# Patient Record
Sex: Female | Born: 1960 | Race: Black or African American | Hispanic: No | Marital: Single | Smoking: Never smoker
Health system: Southern US, Community
[De-identification: ages and names within clinical notes are randomized; demographics above are authoritative.]

## PROBLEM LIST (undated history)

## (undated) DIAGNOSIS — E854 Organ-limited amyloidosis: Secondary | ICD-10-CM

## (undated) DIAGNOSIS — Z79899 Other long term (current) drug therapy: Secondary | ICD-10-CM

## (undated) DIAGNOSIS — D638 Anemia in other chronic diseases classified elsewhere: Secondary | ICD-10-CM

## (undated) DIAGNOSIS — N183 Chronic kidney disease, stage 3 unspecified: Secondary | ICD-10-CM

## (undated) DIAGNOSIS — K819 Cholecystitis, unspecified: Secondary | ICD-10-CM

## (undated) DIAGNOSIS — G510 Bell's palsy: Secondary | ICD-10-CM

## (undated) DIAGNOSIS — Z79631 Long term (current) use of antimetabolite agent: Secondary | ICD-10-CM

## (undated) DIAGNOSIS — E859 Amyloidosis, unspecified: Secondary | ICD-10-CM

## (undated) DIAGNOSIS — R55 Syncope and collapse: Secondary | ICD-10-CM

## (undated) DIAGNOSIS — Z86718 Personal history of other venous thrombosis and embolism: Secondary | ICD-10-CM

## (undated) DIAGNOSIS — N049 Nephrotic syndrome with unspecified morphologic changes: Secondary | ICD-10-CM

## (undated) DIAGNOSIS — I5030 Unspecified diastolic (congestive) heart failure: Secondary | ICD-10-CM

## (undated) DIAGNOSIS — M069 Rheumatoid arthritis, unspecified: Secondary | ICD-10-CM

## (undated) DIAGNOSIS — I43 Cardiomyopathy in diseases classified elsewhere: Secondary | ICD-10-CM

## (undated) HISTORY — PX: COLONOSCOPY: SHX174

## (undated) HISTORY — PX: ABDOMINAL SURGERY: SHX537

## (undated) HISTORY — PX: TOTAL KNEE ARTHROPLASTY: SHX125

---

## 2010-07-13 DIAGNOSIS — M069 Rheumatoid arthritis, unspecified: Secondary | ICD-10-CM | POA: Diagnosis present

## 2021-01-28 DIAGNOSIS — R55 Syncope and collapse: Secondary | ICD-10-CM | POA: Insufficient documentation

## 2021-01-28 DIAGNOSIS — R778 Other specified abnormalities of plasma proteins: Secondary | ICD-10-CM | POA: Diagnosis present

## 2021-02-13 DIAGNOSIS — I10 Essential (primary) hypertension: Secondary | ICD-10-CM | POA: Diagnosis present

## 2021-02-24 DIAGNOSIS — D631 Anemia in chronic kidney disease: Secondary | ICD-10-CM | POA: Diagnosis present

## 2021-02-24 DIAGNOSIS — N189 Chronic kidney disease, unspecified: Secondary | ICD-10-CM | POA: Diagnosis present

## 2021-03-20 DIAGNOSIS — I825Y2 Chronic embolism and thrombosis of unspecified deep veins of left proximal lower extremity: Secondary | ICD-10-CM | POA: Diagnosis present

## 2021-03-20 DIAGNOSIS — Z7901 Long term (current) use of anticoagulants: Secondary | ICD-10-CM | POA: Insufficient documentation

## 2021-03-20 DIAGNOSIS — Z789 Other specified health status: Secondary | ICD-10-CM | POA: Diagnosis present

## 2021-03-20 DIAGNOSIS — IMO0001 Reserved for inherently not codable concepts without codable children: Secondary | ICD-10-CM | POA: Diagnosis present

## 2021-03-22 DIAGNOSIS — R42 Dizziness and giddiness: Secondary | ICD-10-CM | POA: Insufficient documentation

## 2021-03-22 DIAGNOSIS — R55 Syncope and collapse: Secondary | ICD-10-CM

## 2021-04-20 DIAGNOSIS — E8581 Light chain (AL) amyloidosis: Secondary | ICD-10-CM | POA: Diagnosis present

## 2021-05-06 DIAGNOSIS — I43 Cardiomyopathy in diseases classified elsewhere: Secondary | ICD-10-CM | POA: Diagnosis present

## 2021-05-06 DIAGNOSIS — E854 Organ-limited amyloidosis: Secondary | ICD-10-CM | POA: Diagnosis present

## 2021-08-26 ENCOUNTER — Other Ambulatory Visit: Payer: Self-pay

## 2021-08-26 ENCOUNTER — Emergency Department: Payer: Medicare (Managed Care)

## 2021-08-26 ENCOUNTER — Encounter: Payer: Self-pay | Admitting: Internal Medicine

## 2021-08-26 ENCOUNTER — Inpatient Hospital Stay
Admission: EM | Admit: 2021-08-26 | Discharge: 2021-08-31 | DRG: 312 | Disposition: A | Discharge status: Home Health | Payer: Medicare (Managed Care) | Attending: Internal Medicine | Admitting: Internal Medicine

## 2021-08-26 DIAGNOSIS — D61818 Other pancytopenia: Secondary | ICD-10-CM | POA: Diagnosis present

## 2021-08-26 DIAGNOSIS — D631 Anemia in chronic kidney disease: Secondary | ICD-10-CM | POA: Diagnosis present

## 2021-08-26 DIAGNOSIS — I214 Non-ST elevation (NSTEMI) myocardial infarction: Secondary | ICD-10-CM

## 2021-08-26 DIAGNOSIS — Z86711 Personal history of pulmonary embolism: Secondary | ICD-10-CM | POA: Diagnosis not present

## 2021-08-26 DIAGNOSIS — E876 Hypokalemia: Secondary | ICD-10-CM | POA: Diagnosis not present

## 2021-08-26 DIAGNOSIS — R55 Syncope and collapse: Secondary | ICD-10-CM | POA: Diagnosis not present

## 2021-08-26 DIAGNOSIS — E854 Organ-limited amyloidosis: Secondary | ICD-10-CM | POA: Diagnosis present

## 2021-08-26 DIAGNOSIS — Z20822 Contact with and (suspected) exposure to covid-19: Secondary | ICD-10-CM | POA: Diagnosis present

## 2021-08-26 DIAGNOSIS — Z86718 Personal history of other venous thrombosis and embolism: Secondary | ICD-10-CM | POA: Diagnosis not present

## 2021-08-26 DIAGNOSIS — IMO0001 Reserved for inherently not codable concepts without codable children: Secondary | ICD-10-CM | POA: Diagnosis present

## 2021-08-26 DIAGNOSIS — Z789 Other specified health status: Secondary | ICD-10-CM | POA: Diagnosis present

## 2021-08-26 DIAGNOSIS — E669 Obesity, unspecified: Secondary | ICD-10-CM | POA: Diagnosis present

## 2021-08-26 DIAGNOSIS — T451X5A Adverse effect of antineoplastic and immunosuppressive drugs, initial encounter: Secondary | ICD-10-CM | POA: Diagnosis present

## 2021-08-26 DIAGNOSIS — Z8261 Family history of arthritis: Secondary | ICD-10-CM

## 2021-08-26 DIAGNOSIS — N179 Acute kidney failure, unspecified: Secondary | ICD-10-CM | POA: Diagnosis not present

## 2021-08-26 DIAGNOSIS — E8581 Light chain (AL) amyloidosis: Secondary | ICD-10-CM | POA: Diagnosis present

## 2021-08-26 DIAGNOSIS — I825Y2 Chronic embolism and thrombosis of unspecified deep veins of left proximal lower extremity: Secondary | ICD-10-CM | POA: Diagnosis present

## 2021-08-26 DIAGNOSIS — Z7901 Long term (current) use of anticoagulants: Secondary | ICD-10-CM

## 2021-08-26 DIAGNOSIS — E440 Moderate protein-calorie malnutrition: Secondary | ICD-10-CM | POA: Diagnosis present

## 2021-08-26 DIAGNOSIS — G510 Bell's palsy: Secondary | ICD-10-CM | POA: Diagnosis present

## 2021-08-26 DIAGNOSIS — R079 Chest pain, unspecified: Secondary | ICD-10-CM | POA: Diagnosis not present

## 2021-08-26 DIAGNOSIS — E860 Dehydration: Secondary | ICD-10-CM | POA: Diagnosis present

## 2021-08-26 DIAGNOSIS — N1832 Chronic kidney disease, stage 3b: Secondary | ICD-10-CM | POA: Diagnosis present

## 2021-08-26 DIAGNOSIS — M069 Rheumatoid arthritis, unspecified: Secondary | ICD-10-CM | POA: Diagnosis present

## 2021-08-26 DIAGNOSIS — Z833 Family history of diabetes mellitus: Secondary | ICD-10-CM

## 2021-08-26 DIAGNOSIS — E538 Deficiency of other specified B group vitamins: Secondary | ICD-10-CM | POA: Diagnosis present

## 2021-08-26 DIAGNOSIS — Z79899 Other long term (current) drug therapy: Secondary | ICD-10-CM

## 2021-08-26 DIAGNOSIS — R778 Other specified abnormalities of plasma proteins: Secondary | ICD-10-CM | POA: Diagnosis not present

## 2021-08-26 DIAGNOSIS — Z6834 Body mass index (BMI) 34.0-34.9, adult: Secondary | ICD-10-CM | POA: Diagnosis not present

## 2021-08-26 DIAGNOSIS — N183 Chronic kidney disease, stage 3 unspecified: Secondary | ICD-10-CM | POA: Diagnosis not present

## 2021-08-26 DIAGNOSIS — I248 Other forms of acute ischemic heart disease: Secondary | ICD-10-CM | POA: Diagnosis present

## 2021-08-26 DIAGNOSIS — Z888 Allergy status to other drugs, medicaments and biological substances status: Secondary | ICD-10-CM

## 2021-08-26 DIAGNOSIS — Z91040 Latex allergy status: Secondary | ICD-10-CM

## 2021-08-26 DIAGNOSIS — D638 Anemia in other chronic diseases classified elsewhere: Secondary | ICD-10-CM | POA: Diagnosis not present

## 2021-08-26 DIAGNOSIS — N189 Chronic kidney disease, unspecified: Secondary | ICD-10-CM | POA: Diagnosis present

## 2021-08-26 DIAGNOSIS — E859 Amyloidosis, unspecified: Secondary | ICD-10-CM | POA: Diagnosis not present

## 2021-08-26 DIAGNOSIS — I43 Cardiomyopathy in diseases classified elsewhere: Secondary | ICD-10-CM | POA: Diagnosis present

## 2021-08-26 DIAGNOSIS — I5032 Chronic diastolic (congestive) heart failure: Secondary | ICD-10-CM | POA: Diagnosis present

## 2021-08-26 DIAGNOSIS — I130 Hypertensive heart and chronic kidney disease with heart failure and stage 1 through stage 4 chronic kidney disease, or unspecified chronic kidney disease: Secondary | ICD-10-CM | POA: Diagnosis present

## 2021-08-26 DIAGNOSIS — G909 Disorder of the autonomic nervous system, unspecified: Secondary | ICD-10-CM | POA: Diagnosis present

## 2021-08-26 DIAGNOSIS — I951 Orthostatic hypotension: Secondary | ICD-10-CM | POA: Diagnosis present

## 2021-08-26 DIAGNOSIS — Z91041 Radiographic dye allergy status: Secondary | ICD-10-CM

## 2021-08-26 DIAGNOSIS — Z8611 Personal history of tuberculosis: Secondary | ICD-10-CM | POA: Diagnosis not present

## 2021-08-26 DIAGNOSIS — I10 Essential (primary) hypertension: Secondary | ICD-10-CM | POA: Diagnosis present

## 2021-08-26 DIAGNOSIS — Z8249 Family history of ischemic heart disease and other diseases of the circulatory system: Secondary | ICD-10-CM

## 2021-08-26 DIAGNOSIS — R7989 Other specified abnormal findings of blood chemistry: Secondary | ICD-10-CM | POA: Diagnosis not present

## 2021-08-26 DIAGNOSIS — R42 Dizziness and giddiness: Secondary | ICD-10-CM | POA: Diagnosis not present

## 2021-08-26 HISTORY — DX: Long term (current) use of antimetabolite agent: Z79.631

## 2021-08-26 HISTORY — DX: Unspecified diastolic (congestive) heart failure: I50.30

## 2021-08-26 HISTORY — DX: Cardiomyopathy in diseases classified elsewhere: I43

## 2021-08-26 HISTORY — DX: Anemia in other chronic diseases classified elsewhere: D63.8

## 2021-08-26 HISTORY — DX: Rheumatoid arthritis, unspecified: M06.9

## 2021-08-26 HISTORY — DX: Organ-limited amyloidosis: E85.4

## 2021-08-26 HISTORY — DX: Syncope and collapse: R55

## 2021-08-26 HISTORY — DX: Nephrotic syndrome with unspecified morphologic changes: N04.9

## 2021-08-26 HISTORY — DX: Other long term (current) drug therapy: Z79.899

## 2021-08-26 HISTORY — DX: Cholecystitis, unspecified: K81.9

## 2021-08-26 HISTORY — DX: Amyloidosis, unspecified: E85.9

## 2021-08-26 HISTORY — DX: Chronic kidney disease, stage 3 unspecified: N18.30

## 2021-08-26 HISTORY — DX: Personal history of other venous thrombosis and embolism: Z86.718

## 2021-08-26 HISTORY — DX: Bell's palsy: G51.0

## 2021-08-26 LAB — COMPREHENSIVE METABOLIC PANEL
ALT: 10 U/L (ref 0–44)
AST: 15 U/L (ref 15–41)
Albumin: 2.5 g/dL — ABNORMAL LOW (ref 3.5–5.0)
Alkaline Phosphatase: 49 U/L (ref 38–126)
Anion gap: 12 (ref 5–15)
BUN: 22 mg/dL — ABNORMAL HIGH (ref 6–20)
CO2: 19 mmol/L — ABNORMAL LOW (ref 22–32)
Calcium: 8.1 mg/dL — ABNORMAL LOW (ref 8.9–10.3)
Chloride: 107 mmol/L (ref 98–111)
Creatinine, Ser: 1.87 mg/dL — ABNORMAL HIGH (ref 0.44–1.00)
GFR, Estimated: 31 mL/min — ABNORMAL LOW (ref >60)
Glucose, Bld: 100 mg/dL — ABNORMAL HIGH (ref 70–99)
Potassium: 3.3 mmol/L — ABNORMAL LOW (ref 3.5–5.1)
Sodium: 138 mmol/L (ref 135–145)
Total Bilirubin: 1.1 mg/dL (ref 0.3–1.2)
Total Protein: 4.3 g/dL — ABNORMAL LOW (ref 6.5–8.1)

## 2021-08-26 LAB — CBC
HCT: 27.9 % — ABNORMAL LOW (ref 36.0–46.0)
Hemoglobin: 9.2 g/dL — ABNORMAL LOW (ref 12.0–15.0)
MCH: 32.1 pg (ref 26.0–34.0)
MCHC: 33 g/dL (ref 30.0–36.0)
MCV: 97.2 fL (ref 80.0–100.0)
Platelets: 67 10*3/uL — ABNORMAL LOW (ref 150–400)
RBC: 2.87 MIL/uL — ABNORMAL LOW (ref 3.87–5.11)
RDW: 16.7 % — ABNORMAL HIGH (ref 11.5–15.5)
WBC: 3 10*3/uL — ABNORMAL LOW (ref 4.0–10.5)
nRBC: 0 % (ref 0.0–0.2)

## 2021-08-26 LAB — MAGNESIUM: Magnesium: 1.6 mg/dL — ABNORMAL LOW (ref 1.7–2.4)

## 2021-08-26 LAB — RESP PANEL BY RT-PCR (FLU A&B, COVID) ARPGX2
Influenza A by PCR: NEGATIVE
Influenza B by PCR: NEGATIVE
SARS Coronavirus 2 by RT PCR: NEGATIVE

## 2021-08-26 LAB — APTT: aPTT: 20 seconds — ABNORMAL LOW (ref 24–36)

## 2021-08-26 LAB — TROPONIN I (HIGH SENSITIVITY)
Troponin I (High Sensitivity): 170 ng/L (ref <18)
Troponin I (High Sensitivity): 189 ng/L (ref <18)

## 2021-08-26 LAB — PROTIME-INR
INR: 1 (ref 0.8–1.2)
Prothrombin Time: 13.3 seconds (ref 11.4–15.2)

## 2021-08-26 MED ORDER — ACETAMINOPHEN 325 MG PO TABS: 650.0000 mg | ORAL_TABLET | ORAL | Status: DC | PRN

## 2021-08-26 MED ORDER — ONDANSETRON HCL 4 MG/2ML IJ SOLN: 4.0000 mg | Freq: Four times a day (QID) | INTRAMUSCULAR | Status: DC | PRN

## 2021-08-26 MED ORDER — SODIUM CHLORIDE 0.45 % IV SOLN: INTRAVENOUS | Status: DC

## 2021-08-26 MED ORDER — LACTATED RINGERS IV BOLUS
1000.0000 mL | Freq: Once | INTRAVENOUS | Status: AC
  Administered 2021-08-26: 1000 mL via INTRAVENOUS

## 2021-08-26 MED ORDER — HEPARIN SODIUM (PORCINE) 5000 UNIT/ML IJ SOLN: 4000.0000 [IU] | Freq: Once | INTRAMUSCULAR | Status: DC

## 2021-08-26 MED ORDER — VALACYCLOVIR HCL 500 MG PO TABS
500.0000 mg | ORAL_TABLET | Freq: Every day | ORAL | Status: DC
  Administered 2021-08-27 – 2021-08-31 (×5): 500 mg via ORAL
  Filled 2021-08-26 (×5): qty 1

## 2021-08-26 MED ORDER — HEPARIN (PORCINE) 25000 UT/250ML-% IV SOLN
800.0000 [IU]/h | INTRAVENOUS | Status: DC
  Administered 2021-08-27: 800 [IU]/h via INTRAVENOUS
  Filled 2021-08-26: qty 250

## 2021-08-26 MED ORDER — VITAMIN B-6 50 MG PO TABS
25.0000 mg | ORAL_TABLET | Freq: Every day | ORAL | Status: DC
  Administered 2021-08-27 – 2021-08-31 (×5): 25 mg via ORAL
  Filled 2021-08-26 (×5): qty 0.5

## 2021-08-26 MED ORDER — PROCHLORPERAZINE MALEATE 10 MG PO TABS
10.0000 mg | ORAL_TABLET | Freq: Four times a day (QID) | ORAL | Status: DC | PRN
  Filled 2021-08-26: qty 1

## 2021-08-26 MED ORDER — VITAMIN D 25 MCG (1000 UNIT) PO TABS
5000.0000 [IU] | ORAL_TABLET | Freq: Every day | ORAL | Status: DC
  Administered 2021-08-27 – 2021-08-31 (×5): 5000 [IU] via ORAL
  Filled 2021-08-26 (×5): qty 5

## 2021-08-26 MED ORDER — LORATADINE 10 MG PO TABS
10.0000 mg | ORAL_TABLET | Freq: Every day | ORAL | Status: DC
  Administered 2021-08-27 – 2021-08-31 (×5): 10 mg via ORAL
  Filled 2021-08-26 (×5): qty 1

## 2021-08-26 MED ORDER — VITAMIN B-12 1000 MCG PO TABS
1000.0000 ug | ORAL_TABLET | Freq: Every day | ORAL | Status: DC
  Administered 2021-08-27 – 2021-08-31 (×5): 1000 ug via ORAL
  Filled 2021-08-26 (×5): qty 1

## 2021-08-26 MED ORDER — HEPARIN BOLUS VIA INFUSION
3800.0000 [IU] | Freq: Once | INTRAVENOUS | Status: AC
  Administered 2021-08-27: 3800 [IU] via INTRAVENOUS
  Filled 2021-08-26: qty 3800

## 2021-08-26 MED ORDER — MAGNESIUM SULFATE 2 GM/50ML IV SOLN
2.0000 g | Freq: Once | INTRAVENOUS | Status: AC
  Administered 2021-08-27: 2 g via INTRAVENOUS
  Filled 2021-08-26: qty 50

## 2021-08-26 NOTE — ED Notes (Signed)
Pt OTF with CT, aware of need for urine sample.

## 2021-08-26 NOTE — ED Provider Notes (Signed)
[facility] Emergency Department Provider Note ____________________________________________   Event Date/Time   First MD Initiated Contact with Patient 08/26/21 2046     (approximate)  I have reviewed the triage vital signs and the nursing notes.  HISTORY  Chief Complaint Loss of Consciousness   HPI Tammy Hardin is a 60 y.o. femalewho presents to the ED for evaluation of dizziness and feeling poorly.   Chart review indicates no history in our system. Patient self-reports a history of DVT on Eliquis, amyloidosis receiving chemotherapy infusions at [facility].  Bell's palsy on valacyclovir. She lives at home alone, but her son and daughter each see her once a week and spend 1 or 2 days with her, and she stays by herself for a few days per week. I review some MyChart results on patient's phone at the bedside, including a troponin of 150 from 4 weeks ago.  She presents today with her son, who picked her up today, for evaluation of a possible syncopal episode.  Patient reports that she often gets these episodes where she feels dizzy when she stands up and sometimes syncopizes.  She reports an episode today where she stood up from a seated position in a car, that was typical for these intermittent and chronic episodes.  Son reports witnessing this episode and her eyes glazed over and she stared off into space, he reports that her right arm and right leg were stiff.  This lasted about a minute before self resolving.  No rhythmic activity, tongue biting or incontinence.  Patient reports some episodes of epigastric/lower chest discomfort intermittently, as recently as 2 to 3 days ago.  Denies emesis, syncope, fever or shortness of breath.  No past medical history on file.  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 Authorizing Provider  Cholecalciferol 125 MCG (5000 UT) TABS Take 1 tablet by mouth daily. 03/20/21  Yes [provider]  Cyanocobalamin 1000 MCG SUBL Place 1 tablet under the tongue daily. 03/20/21 03/20/22 Yes [provider]  ELIQUIS 2.5 MG TABS tablet Take 2.5 mg by mouth 2 (two) times daily. 08/15/21  Yes [provider]  loratadine (CLARITIN) 10 MG tablet Take 10 mg by mouth daily.   Yes [provider]  prochlorperazine (COMPAZINE) 10 MG tablet Take 10 mg by mouth every 6 (six) hours as needed. 08/23/21  Yes [provider]  pyridOXINE (VITAMIN B-6) 25 MG tablet Take 25 mg by mouth daily.   Yes [provider]  valACYclovir (VALTREX) 500 MG tablet Take 500 mg by mouth daily. 08/16/21  Yes [provider]  clotrimazole (MYCELEX) 10 MG troche  07/30/21   [provider]    Allergies Humira [adalimumab], Iodine, and Latex  No family history on file.  Social History    Review of Systems  Constitutional: No fever/chills.  Positive for episodic weakness and possible syncope. Eyes: No visual changes. ENT: No sore throat. Cardiovascular: Positive for  chest pain. Respiratory: Denies shortness of breath. Gastrointestinal: No abdominal pain.  No nausea, no vomiting.  No diarrhea.  No constipation. Genitourinary: Negative for dysuria. Musculoskeletal: Negative for back pain. Skin: Negative for rash. Neurological: Negative for headaches, focal weakness or numbness. ____________________________________________   PHYSICAL  EXAM:  VITAL SIGNS: Vitals:   08/26/21 2200 08/26/21 2230  BP: 123/71 115/70  Pulse: 74 79  Resp: 12 14  Temp:    SpO2: 100% 100%     Constitutional: Alert and oriented. Well appearing and in no acute distress. Eyes: Conjunctivae are normal. PERRL. EOMI. Head: Atraumatic. Nose: No congestion/rhinnorhea. Mouth/Throat: Mucous membranes are moist.  Oropharynx non-erythematous. Neck: No stridor. No cervical spine tenderness to palpation. Cardiovascular: Normal rate, regular rhythm. Grossly normal heart sounds.  Good peripheral circulation. Respiratory: Normal respiratory effort.  No retractions. Lungs CTAB. Gastrointestinal: Soft , nondistended, nontender to palpation. No CVA tenderness. Musculoskeletal: No lower extremity tenderness nor edema.  No joint effusions. No signs of acute trauma. Neurologic:  Normal speech and language. No gross focal neurologic deficits are appreciated.  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 Skin:  Skin is warm, dry and intact. No rash noted. Psychiatric: Mood and affect are normal. Speech and behavior are normal.  ____________________________________________   LABS (all labs ordered are listed, but only abnormal results are displayed)  Labs Reviewed  CBC - Abnormal; Notable for the following components:      Result Value   WBC 3.0 (*)    RBC 2.87 (*)    Hemoglobin 9.2 (*)    HCT 27.9 (*)    RDW 16.7 (*)    Platelets 67 (*)    All other components within normal limits  COMPREHENSIVE METABOLIC PANEL - Abnormal; Notable for the following components:   Potassium 3.3 (*)    CO2 19 (*)    Glucose, Bld 100 (*)    BUN 22 (*)    Creatinine, Ser 1.87 (*)    Calcium 8.1 (*)    Total Protein 4.3 (*)    Albumin 2.5 (*)    GFR, Estimated 31 (*)    All other components within normal limits  MAGNESIUM - Abnormal; Notable for the following components:   Magnesium 1.6 (*)    All other components  within normal limits  APTT - Abnormal; Notable for the following components:   aPTT 20 (*)    All other components within normal limits  TROPONIN I (HIGH SENSITIVITY) - Abnormal; Notable for the following components:   Troponin I (High Sensitivity) 170 (*)    All other components within normal limits  TROPONIN I (HIGH SENSITIVITY) - Abnormal; Notable for the following components:   Troponin I (High Sensitivity) 189 (*)    All other components within normal limits  RESP PANEL BY RT-PCR (FLU A&B, COVID) [id number]  PROTIME-INR  URINALYSIS, COMPLETE (UACMP) WITH MICROSCOPIC   ____________________________________________  12 Lead EKG  Sinus rhythm with a rate of 78 bpm.  Normal axis and intervals.  No evidence of acute ischemia. ____________________________________________  RADIOLOGY  ED MD interpretation: CT head reviewed by me without evidence of acute intracranial pathology. 1 view CXR reviewed by me without evidence of acute cardiopulmonary pathology.  Official radiology report(s): CT HEAD WO CONTRAST ( )  Result Date: 08/26/2021 CLINICAL DATA:  Possible seizure and syncope. EXAM: CT HEAD WITHOUT CONTRAST TECHNIQUE: Contiguous axial images were obtained from the base of the skull through the vertex without intravenous contrast. COMPARISON:  None.  FINDINGS: Brain: No evidence of acute infarction, hemorrhage, hydrocephalus, extra-axial collection or mass lesion/mass effect. Vascular: No hyperdense vessel or unexpected calcification. Skull: Normal. Negative for fracture or focal lesion. Sinuses/Orbits: No acute finding. Other: None. IMPRESSION: No acute intracranial pathology. Electronically Signed   By: Aram Candela M.D.   On: 08/26/2021 21:59   DG Chest Portable 1 View  Result Date: 08/26/2021 CLINICAL DATA:  Possible MI. Syncope versus shaking. Evaluate for infiltrate. EXAM: PORTABLE CHEST 1 VIEW COMPARISON:  None. FINDINGS: No pneumothorax. The heart, hila, and mediastinum are  normal. No pulmonary nodules or masses. No focal infiltrates. IMPRESSION: No active disease. Electronically Signed   By: Gerome Sam III M.D.   On: 08/26/2021 21:37    ____________________________________________   PROCEDURES and INTERVENTIONS  Procedure(s) performed (including Critical Care):  .1-3 Lead EKG Interpretation  Date/Time: 08/26/2021 11:05 PM Performed by: Delton Prairie, MD Authorized by: Delton Prairie, MD     Interpretation: normal     ECG rate:  74   ECG rate assessment: normal     Rhythm: sinus rhythm     Ectopy: none     Conduction: normal   .Critical Care  Date/Time: 08/26/2021 11:05 PM Performed by: Delton Prairie, MD Authorized by: Delton Prairie, MD   Critical care provider statement:    Critical care time (minutes):  45   Critical care was necessary to treat or prevent imminent or life-threatening deterioration of the following conditions:  Cardiac failure   Critical care was time spent personally by me on the following activities:  Discussions with consultants, evaluation of patient's response to treatment, examination of patient, ordering and performing treatments and interventions, ordering and review of laboratory studies, ordering and review of radiographic studies, pulse oximetry, re-evaluation of patient's condition, obtaining history from patient or surrogate and review of old charts  Medications  heparin injection 4,000 Units (has no administration in time range)  lactated ringers bolus 1,000 mL (1,000 mLs Intravenous New Bag/Given 08/26/21 2141)    ____________________________________________   MDM / ED COURSE   60 year old woman with a history of DVT on Eliquis and amyloidosis, presents to the ED with worsening episodic dizziness and presyncope, sounds like vasovagal syncope, but with increasing troponins and some intermittent atypical chest pains over the past few weeks, requiring medical admission for cardiac evaluation for NSTEMI.  Blood work  shows rising troponins, but her EKG is reassuring without ischemic features.  Imaging is reassuring without evidence of ICH, CVA or cardiopulmonary infiltrates on CXR.  Blood work with CKD around baseline.  Due to her episodic dizziness and increasing troponins, concern for cardiac pathology, we will start her on heparin and discuss with medicine for admission.  Clinical Course as of 08/26/21 2305  Wynelle Link Aug 26, 2021  2124 I reivew MyChart from [place] on [employer]. Less concerned now.  [DS]  2245 I discussed with son and patient at the bedside.  She reports feeling fine is sitting up drinking some water, and watching television.  We talked about her increasing troponins and a concern for possible cardiac pathology.  We discussed observation admission and starting heparin until cardiac evaluation.  She is in agreement. [DS]    Clinical Course User Index [DS] Delton Prairie, MD    ____________________________________________   FINAL CLINICAL IMPRESSION(S) / ED DIAGNOSES  Final diagnoses:  NSTEMI (non-ST elevated myocardial infarction) (HCC)  Syncope, unspecified syncope type     ED Discharge Orders     None  Delton Prairie   Note:  This document was prepared using Conservation officer, historic buildings and may include unintentional dictation errors.    Delton Prairie, MD 08/26/21 [id number]

## 2021-08-26 NOTE — Progress Notes (Signed)
ANTICOAGULATION CONSULT NOTE   Pharmacy Consult for heparin infusion Indication: ACS/STEMI  Allergies  Allergen Reactions   Humira [Adalimumab]    Iodine    Latex     Patient Measurements: Height: 5' (152.4 cm) Weight: 80.7 kg (178 lb) IBW/kg (Calculated) : 45.5 Heparin Dosing Weight: 64 kg  Vital Signs: Temp: 98.5 F (36.9 C) (08/28 1929) Temp Source: Oral (08/28 1929) BP: 115/70 (08/28 2230) Pulse Rate: 79 (08/28 2230)  Labs: Recent Labs    08/26/21 1936 08/26/21 2130  HGB 9.2*  --   HCT 27.9*  --   PLT 67*  --   APTT  --  20*  LABPROT  --  13.3  INR  --  1.0  CREATININE 1.87*  --   TROPONINIHS 170* 189*    Estimated Creatinine Clearance: 30.5 mL/min (A) (by C-G formula based on SCr of 1.87 mg/dL (H)).   Medical History: History reviewed. No pertinent past medical history.  Medications:  PTA Med: Apixaban 2.5 mg BID  Assessment: Pt is 60 yo female w/ hx or DVT, presenting to ED due to dizziness and general malaise found with elevated Troponin I, trending up.  Goal of Therapy:  Heparin level 0.3-0.7 units/ml Monitor platelets by anticoagulation protocol: Yes Target aPTT 66-102   Plan:  Give bolus 3800 units x 1 Start heparin infusion at 800 units/hr Due to DOAC PTA, will follow aPTT until correlation with HL Check aPTT in 6 hrs after start of infusion Daily CBC while on heparin.  Otelia Sergeant, PharmD, [facility] 08/26/2021 11:25 PM

## 2021-08-26 NOTE — ED Notes (Signed)
Awaiting heparin gtt orders to administer bolus. Per MD, gtt orders to come.

## 2021-08-26 NOTE — ED Triage Notes (Signed)
Per ems pt with possible syncope vs. "Shaking". Pt states she stood up and felt like she passed out. Pt states has not felt well this week and has not really consumed any po. Pt states has had diarrhea and some shob.

## 2021-08-26 NOTE — H&P (Signed)
History and Physical   Mora Pedraza FIE:[id number] DOB: [birth year] DOA: 08/26/2021  Referring MD/NP/PA: Dr. Delton Prairie  PCP: Reather Converse, MD   Outpatient Specialists: At [facility]  Patient coming from: Home  Chief Complaint: Chest pain and passing out  HPI: Tammy Hardin is a 60 y.o. female with medical history significant of disseminated amyloidosis including cardiac amyloidosis, history of DVT on Eliquis, rheumatoid arthritis, chronically elevated troponins, anemia of chronic disease, chronic kidney disease stage II who was brought in secondary to syncopal episode and chest pain.  Patient has been getting chemotherapy apparently at [facility] for amyloidosis.  She has had on and off dizzy spells with occasional chest discomfort.  She was recently seen at [facility] apparently and had some mildly elevated troponins but no admission or work-up was performed.  Probably presumed to be due to cardiac amyloid.  She presented today after a weakness syncopal episode by her son.  Patient also had some chest discomfort.  This lasted a few minutes.  No fever or chills no nausea vomiting but had some diaphoresis.  Patient was seen in the ER and evaluated.  EKG appears to be stable but troponin seems to be rising.  She is therefore presumed to have had low to intermediate risk non-ST elevation MI and is being admitted for further evaluation and treatment.  ED Course: Temperature 98.5 blood pressure 130/79, pulse 83 respiratory of 19 oxygen sat 100% on room air.  White count 3.1 hemoglobin 9.1 platelets 67.  Sodium is 138 potassium 3.3 chloride 107 CO2 of 19 BUN 22 creatinine 1.87 and calcium 8.1. Magnesium 1.6.  Initial troponin 177 troponin 189.  INR 1.0.  Respiratory panel negative.  Head CT without contrast showed no acute findings.  Chest x-ray shows no acute disease.  Patient being admitted for treatment and management of non-ST elevation MI.  Review of Systems: As per HPI otherwise 10  point review of systems negative.    History reviewed. No pertinent past medical history.  History reviewed. No pertinent surgical history.   reports that she has never smoked. She has never used smokeless tobacco. No history on file for alcohol use and drug use.  Allergies  Allergen Reactions   Humira [Adalimumab]    Iodine    Latex     History reviewed. No pertinent family history.   Prior to Admission medications   Medication Sig Start Date End Date Taking? Authorizing Provider  Cholecalciferol 125 MCG (5000 UT) TABS Take 1 tablet by mouth daily. 03/20/21  Yes [provider]  Cyanocobalamin 1000 MCG SUBL Place 1 tablet under the tongue daily. 03/20/21 03/20/22 Yes [provider]  ELIQUIS 2.5 MG TABS tablet Take 2.5 mg by mouth 2 (two) times daily. 08/15/21  Yes [provider]  loratadine (CLARITIN) 10 MG tablet Take 10 mg by mouth daily.   Yes [provider]  prochlorperazine (COMPAZINE) 10 MG tablet Take 10 mg by mouth every 6 (six) hours as needed. 08/23/21  Yes [provider]  pyridOXINE (VITAMIN B-6) 25 MG tablet Take 25 mg by mouth daily.   Yes [provider]  valACYclovir (VALTREX) 500 MG tablet Take 500 mg by mouth daily. 08/16/21  Yes [provider]  clotrimazole (MYCELEX) 10 MG troche  07/30/21   [provider]    Physical Exam: Vitals:   08/26/21 2100 08/26/21 2130 08/26/21 2200 08/26/21 2230  BP: 119/77 131/79 123/71 115/70  Pulse: 75 83 74 79  Resp: 19 16 12  14  Temp:      TempSrc:      SpO2: 100% 100% 100% 100%  Weight:      Height:          Constitutional: Acutely ill looking, anxious Vitals:   08/26/21 2100 08/26/21 2130 08/26/21 2200 08/26/21 2230  BP: 119/77 131/79 123/71 115/70  Pulse: 75 83 74 79  Resp: Temp:      TempSrc:      SpO2: 100% 100% 100% 100%  Weight:      Height:       Eyes: PERRL, lids and conjunctivae normal ENMT: Mucous membranes are dry.  Posterior pharynx clear of any exudate or lesions.Normal dentition.  Neck: normal, supple, no masses, no thyromegaly Respiratory: clear to auscultation bilaterally, no wheezing, no crackles. Normal respiratory effort. No accessory muscle use.  Cardiovascular: Regular rate and rhythm, grade 2 diastolic murmur/ rubs / gallops. No extremity edema. 2+ pedal pulses. No carotid bruits.  Abdomen: no tenderness, no masses palpated. No hepatosplenomegaly. Bowel sounds positive.  Musculoskeletal: no clubbing / cyanosis. No joint deformity upper and lower extremities. Good ROM, no contractures. Normal muscle tone.  Skin: no rashes, lesions, ulcers. No induration Neurologic: CN 2-12 grossly intact. Sensation intact, DTR normal. Strength 5/5 in all 4.  Psychiatric: Normal judgment and insight. Alert and oriented x 3.  Anxious mood.     Labs on Admission: I have personally reviewed following labs and imaging studies  CBC: Recent Labs  Lab 08/26/21 1936  WBC 3.0*  HGB 9.2*  HCT 27.9*  MCV 97.2  PLT 67*   Basic Metabolic Panel: Recent Labs  Lab 08/26/21 1936 08/26/21 2130  NA 138  --   K 3.3*  --   CL 107  --   CO2 19*  --   GLUCOSE 100*  --   BUN 22*  --   CREATININE 1.87*  --   CALCIUM 8.1*  --   MG  --  1.6*   GFR: Estimated Creatinine Clearance: 30.5 mL/min (A) (by C-G formula based on SCr of 1.87 mg/dL (H)). Liver Function Tests: Recent Labs  Lab 08/26/21 1936  AST 15  ALT 10  ALKPHOS 49  BILITOT 1.1  PROT 4.3*  ALBUMIN 2.5*   No results for input(s): LIPASE, AMYLASE in the last 168 hours. No results for input(s): AMMONIA in the last 168 hours. Coagulation Profile: Recent Labs  Lab 08/26/21 2130  INR 1.0   Cardiac Enzymes: No results for input(s): CKTOTAL, CKMB, CKMBINDEX, TROPONINI in the last 168 hours. BNP (last 3 results) No results for input(s): PROBNP in the last 8760 hours. HbA1C: No results for input(s): HGBA1C in the last 72 hours. CBG: No results for  input(s): GLUCAP in the last 168 hours. Lipid Profile: No results for input(s): CHOL, HDL, LDLCALC, TRIG, CHOLHDL, LDLDIRECT in the last 72 hours. Thyroid Function Tests: No results for input(s): TSH, T4TOTAL, FREET4, T3FREE, THYROIDAB in the last 72 hours. Anemia Panel: No results for input(s): VITAMINB12, FOLATE, FERRITIN, TIBC, IRON, RETICCTPCT in the last 72 hours. Urine analysis: No results found for: COLORURINE, APPEARANCEUR, LABSPEC, PHURINE, GLUCOSEU, HGBUR, BILIRUBINUR, KETONESUR, PROTEINUR, UROBILINOGEN, NITRITE, LEUKOCYTESUR Sepsis Labs: (procalcitonin:4,lacticidven:4) ) Recent Results (from the past 240 hour(s))  Resp Panel by RT-PCR (Flu A&B, Covid) Nasopharyngeal Swab     Status: None   Collection Time: 08/26/21  9:30 PM   Specimen: Nasopharyngeal Swab; Nasopharyngeal(NP) swabs in vial transport medium  Result Value Ref Range Status  SARS Coronavirus 2 by RT PCR NEGATIVE NEGATIVE Final    Comment: (NOTE) SARS-CoV-2 target nucleic acids are NOT DETECTED.  The SARS-CoV-2 RNA is generally detectable in upper respiratory specimens during the acute phase of infection. The lowest concentration of SARS-CoV-2 viral copies this assay can detect is 138 copies/mL. A negative result does not preclude SARS-Cov-2 infection and should not be used as the sole basis for treatment or other patient management decisions. A negative result may occur with  improper specimen collection/handling, submission of specimen other than nasopharyngeal swab, presence of viral mutation(s) within the areas targeted by this assay, and inadequate number of viral copies(<138 copies/mL). A negative result must be combined with clinical observations, patient history, and epidemiological information. The expected result is Negative.  Fact Sheet for Patients:  BloggerCourse.com  Fact Sheet for Healthcare Providers:  SeriousBroker.it  This test  is no t yet approved or cleared by the [place] FDA and  has been authorized for detection and/or diagnosis of SARS-CoV-2 by FDA under an Emergency Use Authorization (EUA). This EUA will remain  in effect (meaning this test can be used) for the duration of the COVID-19 declaration under Section 564(b)(1) of the Act, 21 U.S.C.section 360bbb-3(b)(1), unless the authorization is terminated  or revoked sooner.       Influenza A by PCR NEGATIVE NEGATIVE Final   Influenza B by PCR NEGATIVE NEGATIVE Final    Comment: (NOTE) The Xpert Xpress SARS-CoV-2/FLU/RSV plus assay is intended as an aid in the diagnosis of influenza from Nasopharyngeal swab specimens and should not be used as a sole basis for treatment. Nasal washings and aspirates are unacceptable for Xpert Xpress SARS-CoV-2/FLU/RSV testing.  Fact Sheet for Patients: BloggerCourse.com  Fact Sheet for Healthcare Providers: SeriousBroker.it  This test is not yet approved or cleared by the [place] FDA and has been authorized for detection and/or diagnosis of SARS-CoV-2 by FDA under an Emergency Use Authorization (EUA). This EUA will remain in effect (meaning this test can be used) for the duration of the COVID-19 declaration under Section 564(b)(1) of the Act, 21 U.S.C. section 360bbb-3(b)(1), unless the authorization is terminated or revoked.  Performed at [facility], [address], [place], [place] [address]      Radiological Exams on Admission: CT HEAD WO CONTRAST ( )  Result Date: 08/26/2021 CLINICAL DATA:  Possible seizure and syncope. EXAM: CT HEAD WITHOUT CONTRAST TECHNIQUE: Contiguous axial images were obtained from the base of the skull through the vertex without intravenous contrast. COMPARISON:  None. FINDINGS: Brain: No evidence of acute infarction, hemorrhage, hydrocephalus, extra-axial collection or mass lesion/mass effect. Vascular: No  hyperdense vessel or unexpected calcification. Skull: Normal. Negative for fracture or focal lesion. Sinuses/Orbits: No acute finding. Other: None. IMPRESSION: No acute intracranial pathology. Electronically Signed   By: Aram Candela M.D.   On: 08/26/2021 21:59   DG Chest Portable 1 View  Result Date: 08/26/2021 CLINICAL DATA:  Possible MI. Syncope versus shaking. Evaluate for infiltrate. EXAM: PORTABLE CHEST 1 VIEW COMPARISON:  None. FINDINGS: No pneumothorax. The heart, hila, and mediastinum are normal. No pulmonary nodules or masses. No focal infiltrates. IMPRESSION: No active disease. Electronically Signed   By: Gerome Sam III M.D.   On: 08/26/2021 21:37    EKG: Independently reviewed.  Shows normal sinus rhythm with a rate of 78, low voltage EKG, no significant ST changes.  Assessment/Plan Principal Problem:   NSTEMI (non-ST elevated myocardial infarction) (HCC) Active Problems:   AL amyloidosis (HCC)   Anemia  in chronic kidney disease   Cardiac amyloidosis (HCC)   Chronic deep vein thrombosis (DVT) of proximal vein of left lower extremity (HCC)   Hypertension   Patient is Jehovah's Witness   Rheumatoid arthritis (HCC)   Syncope and collapse   Troponin level elevated   Pancytopenia (HCC)   Hypomagnesemia   AKI (acute kidney injury) (HCC)     #1 non-ST elevation MI: Patient qualifies for low to medium risk non-ST elevation MI.  Admitted and started on heparin GTT.  Holding her Eliquis momentarily.  Cycle enzymes and get echocardiogram.  Would likely benefit from cardiac consultation in the morning for further evaluation.  In the meantime aspirin and statin will be beneficial most likely.  #2 syncope: Probably as a result of #1 above.  Could also be related to cardiac amyloidosis leading to some arrhythmias.  Will monitor on telemetry and hydrate.  Follow echo results.  #3 essential hypertension: Does not appear to be on any home regimen.  We will monitor if needed and  treat.  #4 disseminated amyloidosis: Including cardiac amyloidosis.  Following with outpatient treatment.  Continue  #5 history of DVT: On Eliquis.  Holding Eliquis for now.  On heparin drip.  #6 history of rheumatoid arthritis: Does not appear to be on active treatment.  Continue to monitor  #7 hypomagnesemia: Replete magnesium  #8 AKI on CKD 3: Hydrate and monitor renal function  #9 pancytopenia: Most likely secondary to recent chemo for amyloidosis.  Continue to monitor.   DVT prophylaxis: Heparin drip Code Status: Full code Family Communication: Son Disposition Plan: Home Consults called: None but cardiology consult in a.m. Admission status: Inpatient  Severity of Illness: The appropriate patient status for this patient is INPATIENT. Inpatient status is judged to be reasonable and necessary in order to provide the required intensity of service to ensure the patient's safety. The patient's presenting symptoms, physical exam findings, and initial radiographic and laboratory data in the context of their chronic comorbidities is felt to place them at high risk for further clinical deterioration. Furthermore, it is not anticipated that the patient will be medically stable for discharge from the hospital within 2 midnights of admission. The following factors support the patient status of inpatient.   " The patient's presenting symptoms include chest pain and syncope. " The worrisome physical exam findings include dehydration. " The initial radiographic and laboratory data are worrisome because of elevated troponins. " The chronic co-morbidities include disseminated amyloidosis.   * I certify that at the point of admission it is my clinical judgment that the patient will require inpatient hospital care spanning beyond 2 midnights from the point of admission due to high intensity of service, high risk for further deterioration and high frequency of surveillance required.Lonia Blood  MD [facility] Pager [phone]  If 7PM-7AM, please contact night-coverage [email or web address] Password [facility]  08/26/2021, 11:19 PM

## 2021-08-27 ENCOUNTER — Encounter: Payer: Self-pay | Admitting: Internal Medicine

## 2021-08-27 ENCOUNTER — Inpatient Hospital Stay (HOSPITAL_COMMUNITY)
Admit: 2021-08-27 | Discharge: 2021-08-27 | Disposition: A | Discharge status: Home / Self Care | Payer: Medicare (Managed Care) | Attending: Internal Medicine | Admitting: Internal Medicine

## 2021-08-27 ENCOUNTER — Inpatient Hospital Stay: Payer: Medicare (Managed Care)

## 2021-08-27 DIAGNOSIS — R7989 Other specified abnormal findings of blood chemistry: Secondary | ICD-10-CM

## 2021-08-27 DIAGNOSIS — E854 Organ-limited amyloidosis: Secondary | ICD-10-CM | POA: Diagnosis not present

## 2021-08-27 DIAGNOSIS — R55 Syncope and collapse: Secondary | ICD-10-CM | POA: Diagnosis not present

## 2021-08-27 DIAGNOSIS — Z8611 Personal history of tuberculosis: Secondary | ICD-10-CM

## 2021-08-27 DIAGNOSIS — N179 Acute kidney failure, unspecified: Secondary | ICD-10-CM

## 2021-08-27 DIAGNOSIS — R42 Dizziness and giddiness: Secondary | ICD-10-CM | POA: Diagnosis not present

## 2021-08-27 DIAGNOSIS — R079 Chest pain, unspecified: Secondary | ICD-10-CM | POA: Diagnosis not present

## 2021-08-27 DIAGNOSIS — R778 Other specified abnormalities of plasma proteins: Secondary | ICD-10-CM

## 2021-08-27 DIAGNOSIS — N183 Chronic kidney disease, stage 3 unspecified: Secondary | ICD-10-CM

## 2021-08-27 DIAGNOSIS — I248 Other forms of acute ischemic heart disease: Secondary | ICD-10-CM | POA: Diagnosis present

## 2021-08-27 DIAGNOSIS — E859 Amyloidosis, unspecified: Secondary | ICD-10-CM

## 2021-08-27 DIAGNOSIS — I43 Cardiomyopathy in diseases classified elsewhere: Secondary | ICD-10-CM

## 2021-08-27 DIAGNOSIS — I951 Orthostatic hypotension: Secondary | ICD-10-CM | POA: Diagnosis not present

## 2021-08-27 DIAGNOSIS — E8581 Light chain (AL) amyloidosis: Secondary | ICD-10-CM

## 2021-08-27 DIAGNOSIS — Z86711 Personal history of pulmonary embolism: Secondary | ICD-10-CM

## 2021-08-27 DIAGNOSIS — D638 Anemia in other chronic diseases classified elsewhere: Secondary | ICD-10-CM

## 2021-08-27 LAB — APTT
aPTT: >160 seconds (ref 24–36)
aPTT: 152 seconds — ABNORMAL HIGH (ref 24–36)

## 2021-08-27 LAB — URINALYSIS, COMPLETE (UACMP) WITH MICROSCOPIC
Bilirubin Urine: NEGATIVE
Glucose, UA: NEGATIVE mg/dL
Ketones, ur: 5 mg/dL — AB
Nitrite: NEGATIVE
Protein, ur: 100 mg/dL — AB
Specific Gravity, Urine: 1.012 (ref 1.005–1.030)
pH: 5 (ref 5.0–8.0)

## 2021-08-27 LAB — ECHOCARDIOGRAM COMPLETE
AR max vel: 2.86 cm2
AV Area VTI: 2.84 cm2
AV Area mean vel: 2.86 cm2
AV Mean grad: 3 mmHg
AV Peak grad: 5.9 mmHg
Ao pk vel: 1.21 m/s
Area-P 1/2: 5.97 cm2
Height: 60 in
MV VTI: 3.27 cm2
S' Lateral: 2.3 cm
Weight: 2848 oz

## 2021-08-27 LAB — LIPID PANEL
Cholesterol: 195 mg/dL (ref 0–200)
HDL: 26 mg/dL — ABNORMAL LOW (ref >40)
LDL Cholesterol: 147 mg/dL — ABNORMAL HIGH (ref 0–99)
Total CHOL/HDL Ratio: 7.5 RATIO
Triglycerides: 108 mg/dL (ref <150)
VLDL: 22 mg/dL (ref 0–40)

## 2021-08-27 LAB — MAGNESIUM: Magnesium: 1.3 mg/dL — ABNORMAL LOW (ref 1.7–2.4)

## 2021-08-27 LAB — CBC
HCT: 21.8 % — ABNORMAL LOW (ref 36.0–46.0)
Hemoglobin: 7.4 g/dL — ABNORMAL LOW (ref 12.0–15.0)
MCH: 33.3 pg (ref 26.0–34.0)
MCHC: 33.9 g/dL (ref 30.0–36.0)
MCV: 98.2 fL (ref 80.0–100.0)
Platelets: 55 10*3/uL — ABNORMAL LOW (ref 150–400)
RBC: 2.22 MIL/uL — ABNORMAL LOW (ref 3.87–5.11)
RDW: 16.9 % — ABNORMAL HIGH (ref 11.5–15.5)
WBC: 2.3 10*3/uL — ABNORMAL LOW (ref 4.0–10.5)
nRBC: 0 % (ref 0.0–0.2)

## 2021-08-27 LAB — TROPONIN I (HIGH SENSITIVITY)
Troponin I (High Sensitivity): 203 ng/L (ref <18)
Troponin I (High Sensitivity): 143 ng/L (ref <18)

## 2021-08-27 LAB — CREATININE, SERUM
Creatinine, Ser: 1.35 mg/dL — ABNORMAL HIGH (ref 0.44–1.00)
GFR, Estimated: 45 mL/min — ABNORMAL LOW (ref >60)

## 2021-08-27 LAB — HEPARIN LEVEL (UNFRACTIONATED): Heparin Unfractionated: 1.01 IU/mL — ABNORMAL HIGH (ref 0.30–0.70)

## 2021-08-27 LAB — HIV ANTIBODY (ROUTINE TESTING W REFLEX): HIV Screen 4th Generation wRfx: NONREACTIVE

## 2021-08-27 MED ORDER — APIXABAN 2.5 MG PO TABS
2.5000 mg | ORAL_TABLET | Freq: Two times a day (BID) | ORAL | Status: DC
  Administered 2021-08-27 – 2021-08-31 (×9): 2.5 mg via ORAL
  Filled 2021-08-27 (×10): qty 1

## 2021-08-27 MED ORDER — GADOBUTROL 1 MMOL/ML IV SOLN
7.0000 mL | Freq: Once | INTRAVENOUS | Status: AC | PRN
  Administered 2021-08-27: 7 mL via INTRAVENOUS

## 2021-08-27 MED ORDER — MAGNESIUM SULFATE 2 GM/50ML IV SOLN
2.0000 g | Freq: Once | INTRAVENOUS | Status: AC
  Administered 2021-08-27: 2 g via INTRAVENOUS
  Filled 2021-08-27: qty 50

## 2021-08-27 MED ORDER — HEPARIN (PORCINE) 25000 UT/250ML-% IV SOLN
650.0000 [IU]/h | INTRAVENOUS | Status: DC
  Administered 2021-08-27: 650 [IU]/h via INTRAVENOUS

## 2021-08-27 MED ORDER — PERFLUTREN LIPID MICROSPHERE
1.0000 mL | INTRAVENOUS | Status: AC | PRN
  Administered 2021-08-27: 2 mL via INTRAVENOUS
  Filled 2021-08-27: qty 10

## 2021-08-27 MED ORDER — HEPARIN SODIUM (PORCINE) 5000 UNIT/ML IJ SOLN
5000.0000 [IU] | Freq: Three times a day (TID) | INTRAMUSCULAR | Status: DC
  Filled 2021-08-27: qty 1

## 2021-08-27 NOTE — Consult Note (Signed)
Cardiology Consultation:   Patient ID: Tammy Hardin; [id number]; Jun 09, 1961   Admit date: 08/26/2021 Date of Consult: 08/27/2021  Primary Care Provider: Mariane Duval, MD Primary Cardiologist: [facility] Primary Electrophysiologist:  None   Patient Profile:   Tammy Hardin is a 60 y.o. female with a hx of possible cardiac amyloidosis followed by [facility], HFpEF, CKD stage III, orthostasis with recurrent presyncope/syncope, anemia of chronic disease, history of DVT on Eliquis, nephrotic syndrome, pancytopenia, RA diagnosed in 2004, and Bell's palsy who is being seen today for the evaluation of elevated troponin at the request of Dr. Manuella Ghazi.  History of Present Illness:   Ms. Dinges this followed by the multidisciplinary team at [facility] for amyloidosis with possible cardiac amyloid.    She started developing symptoms of nausea, vomiting, abdominal pain, early satiety, and decreased appetite in 10/2020, and was evaluated by GI. She underwent EGD and colonoscopy. Biopsies were reviewed by [facility] pathology and demonstrated amyloid deposition. During this time, she had been referred to nephrology due to increase in creatinine and she was found to have 3 g per 24-hour of proteinuria and a kappa light chain of 371. 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 PET/CT did not reveal any lesions. She was referred to [facility] amyloid clinic.  She was admitted to [facility] cardiology service in 12/2020 in the setting of an episode of syncope when she was found to have an elevated troponin. She was noted to have positive orthostatic vital signs during that admission and her syncope was attributed to hypovolemia in the setting of poor appetite and decreased p.o. intake.  Outpatient diuretic has had to be tapered due to poor oral intake with associated dizziness.  On 8/28, patient had gotten out of the car and was sitting in a wheelchair.  While sitting, she  developed sudden onset of right sided rigidity.  She did not suffer LOC and was able to converse, albeit faint, with her son who was present.  Symptoms lasted approximately 1 minute followed by resolution without intervention.  No frank angina or dyspnea.  She does report palpitations during these episodes.  Due to symptoms she presented to [facility] where she was found to have stable vitals.  CT head was nonacute.  EKG nonacute.  High-sensitivity troponin 170 with a delta of 189, currently trended to 203.  Serum creatinine 1.87 which appears to be her approximate baseline.  Potassium 3.3.  Albumin 2.5.  Hgb 9.2.  WBC 3.0.  PLT 67.  Initial high-sensitivity troponin 170 with a delta of 189, currently trended 203.  She was started on a heparin drip.  Never with chest pain or dyspnea.  Currently asymptomatic.   Past Medical History:  Diagnosis Date   (HFpEF) heart failure with preserved ejection fraction (HCC)    Amyloid disease (HCC)    Anemia of chronic disease    Bell's palsy    Cardiac amyloidosis (HCC)    Cholecystitis    Chronic kidney disease (CKD), stage III (moderate) (HCC)    History of DVT (deep vein thrombosis)    Methotrexate, long term, current use    Nephrotic syndrome    RA (rheumatoid arthritis) ([place])    Syncope     Past Surgical History:  Procedure Laterality Date   ABDOMINAL SURGERY     COLONOSCOPY     TOTAL KNEE ARTHROPLASTY       Home Meds: Prior to Admission medications   Medication Sig Start  Date End Date Taking? Authorizing Provider  Cholecalciferol 125 MCG (5000 UT) TABS Take 1 tablet by mouth daily. 03/20/21  Yes [provider]  Cyanocobalamin 1000 MCG SUBL Place 1 tablet under the tongue daily. 03/20/21 03/20/22 Yes [provider]  ELIQUIS 2.5 MG TABS tablet Take 2.5 mg by mouth 2 (two) times daily. 08/15/21  Yes [provider]  loratadine (CLARITIN) 10 MG tablet Take 10 mg by mouth daily.   Yes [provider]  prochlorperazine  (COMPAZINE) 10 MG tablet Take 10 mg by mouth every 6 (six) hours as needed. 08/23/21  Yes [provider]  pyridOXINE (VITAMIN B-6) 25 MG tablet Take 25 mg by mouth daily.   Yes [provider]  valACYclovir (VALTREX) 500 MG tablet Take 500 mg by mouth daily. 08/16/21  Yes [provider]  clotrimazole (MYCELEX) 10 MG troche  07/30/21   [provider]    Inpatient Medications: Scheduled Meds:  cholecalciferol  5,000 Units Oral Daily   loratadine  10 mg Oral Daily   pyridOXINE  25 mg Oral Daily   valACYclovir  500 mg Oral Daily   vitamin B-12  1,000 mcg Oral Daily   Continuous Infusions:  sodium chloride 75 mL/hr at 08/27/21 0242   heparin 650 Units/hr (08/27/21 0930)   PRN Meds: acetaminophen, ondansetron (ZOFRAN) IV, prochlorperazine  Allergies:   Allergies  Allergen Reactions   Humira [Adalimumab]    Iodine    Latex     Social History:   Social History   Socioeconomic History   Marital status: Single    Spouse name: Not on file   Number of children: Not on file   Years of education: Not on file   Highest education level: Not on file  Occupational History   Not on file  Tobacco Use   Smoking status: Never   Smokeless tobacco: Never  Substance and Sexual Activity   Alcohol use: Not on file   Drug use: Not on file   Sexual activity: Not on file  Other Topics Concern   Not on file  Social History Narrative   Not on file   Social Determinants of Health   Financial Resource Strain: Not on file  Food Insecurity: Not on file  Transportation Needs: Not on file  Physical Activity: Not on file  Stress: Not on file  Social Connections: Not on file  Intimate Partner Violence: Not on file     Family History:   Family History  Problem Relation Age of Onset   Hypertension Mother    Arthritis Mother    Diabetes Mother    Heart disease Mother    Arthritis Father    Hypertension Father    Diabetes Father    Heart disease Father      ROS:  Review of Systems  Constitutional:  Positive for malaise/fatigue. Negative for chills, diaphoresis, fever and weight loss.  HENT:  Negative for congestion.   Eyes:  Negative for discharge and redness.  Respiratory:  Negative for cough, sputum production, shortness of breath and wheezing.   Cardiovascular:  Negative for chest pain, palpitations, orthopnea, claudication, leg swelling and PND.  Gastrointestinal:  Negative for abdominal pain, heartburn, nausea and vomiting.  Musculoskeletal:  Negative for falls and myalgias.  Skin:  Negative for rash.  Neurological:  Positive for dizziness and weakness. Negative for tingling, tremors, sensory change, speech change, focal weakness and loss of consciousness.       Right-sided rigidity   Endo/Heme/Allergies:  Does not bruise/bleed easily.  Psychiatric/Behavioral:  Negative for substance abuse. The patient is not nervous/anxious.   All other systems reviewed and are negative.    Physical Exam/Data:   Vitals:   08/27/21 0400 08/27/21 0500 08/27/21 0600 08/27/21 0700  BP: 109/79 116/68 113/76 118/75  Pulse: 71 70 68 67  Resp: (!) $RemoveB'29 15 13 14  'jEJQPggB$ Temp:      TempSrc:      SpO2: 100% 97% 100% 100%  Weight:      Height:        Intake/Output Summary (Last 24 hours) at 08/27/2021 0956 Last data filed at 08/27/2021 0835 Gross per 24 hour  Intake 1424.18 ml  Output --  Net 1424.18 ml   Filed Weights   08/26/21 1930  Weight: 80.7 kg   Body mass index is 34.76 kg/m.   Physical Exam: General: Well developed, well nourished, in no acute distress. Head: Normocephalic, atraumatic, sclera non-icteric, no xanthomas, nares without discharge.  Neck: Negative for carotid bruits. JVD not elevated. Lungs: Clear bilaterally to auscultation without wheezes, rales, or rhonchi. Breathing is unlabored. Heart: RRR with S1 S2. No murmurs, rubs, or gallops appreciated. Abdomen: Soft, non-tender, non-distended with normoactive bowel sounds. No  hepatomegaly. No rebound/guarding. No obvious abdominal masses. Msk:  Strength and tone appear normal for age. Extremities: No clubbing or cyanosis. No edema. Distal pedal pulses are 2+ and equal bilaterally. Neuro: Alert and oriented X 3. Left-sided facial asymmetry at baseline with Bell's palsy. No focal deficit. Moves all extremities spontaneously. Psych:  Responds to questions appropriately with a normal affect.   EKG:  The EKG was personally reviewed and demonstrates: NSR, 78 bpm, low voltage, poor R wave progression, nonspecific st/t changes Telemetry:  Telemetry was personally reviewed and demonstrates: SR  Weights: [employer]   08/26/21 1930  Weight: 80.7 kg    Relevant CV Studies:  2D echo 01/28/2021 [facility]): Summary    1. The left ventricle is normal in size with mildly to moderately increased  wall thickness.    2. The left ventricular systolic function is normal, LVEF is visually  estimated at 60-65%.    3. There is grade I diastolic dysfunction (impaired relaxation).    4. The right ventricle is normal in size, with normal systolic function. __________  Laboratory Data:  Chemistry Recent Labs  Lab 08/26/21 1936  NA 138  K 3.3*  CL 107  CO2 19*  GLUCOSE 100*  BUN 22*  CREATININE 1.87*  CALCIUM 8.1*  GFRNONAA 31*  ANIONGAP 12    Recent Labs  Lab 08/26/21 1936  PROT 4.3*  ALBUMIN 2.5*  AST 15  ALT 10  ALKPHOS 49  BILITOT 1.1   Hematology Recent Labs  Lab 08/26/21 1936  WBC 3.0*  RBC 2.87*  HGB 9.2*  HCT 27.9*  MCV 97.2  MCH 32.1  MCHC 33.0  RDW 16.7*  PLT 67*   Cardiac EnzymesNo results for input(s): TROPONINI in the last 168 hours. No results for input(s): TROPIPOC in the last 168 hours.  BNPNo results for input(s): BNP, PROBNP in the last 168 hours.  DDimer No results for input(s): DDIMER in the last 168 hours.  Radiology/Studies:  CT HEAD WO CONTRAST (5MM)  Result Date: 08/26/2021 IMPRESSION: No acute intracranial pathology.  Electronically Signed   By: Virgina Norfolk M.D.   On: 08/26/2021 21:59   DG Chest Portable 1 View  Result Date: 08/26/2021 IMPRESSION: No active disease. Electronically Signed   By: Dorise Bullion III  M.D.   On: 08/26/2021 21:37    Assessment and Plan:   1. Right-sided rigidity/presyncope with orthostatic hypotension/presumed autonomic dysfunction/malnutrition: -Never with frank syncope -Likely multifactorial including of underlying cardiac amyloidosis, malnutrition with inability to eat for the past week, emesis, deconditioning with bed-bound state this past week -Consider checking orthostatics, though has received IV fluids, readings would not be indicative of out of hospital event -Echo pending -Monitor on telemetry  -Does not appear to be a primary cardiac event -May benefit from outpatient cardiac monitoring  -Would benefit from dietary consult patient indicates she has not been able to keep any foods down this past week -Consider MRI brain  2. Elevated high sensitivity troponin: -Never with angina or dyspnea -Mildly elevated and flat trending, not consistent with ACS, and consistent with prior readings at [facility] earlier this year -Likely in the context of supply demand ischemia secondary to cardiac amyloidosis, CKD stage III, anemia of chronic disease, and possible transient hypotension with associated presyncope -Continue to cycle troponin to peak -Echo pending -Discontinue heparin drip given troponin elevation is not consistent with ACS, lack of anginal symptoms, and in the context of underlying anemia/thrombocytopenia  3. Cardiac amyloidosis: -Echo as above  -Followed by [facility] with evidence of increased wall thickness on echo with relatively low voltage on EKG and appearance of lateral ischemia in addition to chronically elevated cardiac biomarkers  4. CKD stage III: -Felt to be related to amyloid  -Stable -Avoid nephrotoxic agents  4. Anemia of chronic  disease: -Stable  5. History of DVT: -Occurred in the setting of knee surgery, though has been maintained on Eliquis    For questions or updates, please contact [place] Please consult [email or web address] for contact info under Cardiology/STEMI.   Signed, Christell Faith, PA-C [place] Pager: [phone] 08/27/2021, 9:56 AM

## 2021-08-27 NOTE — ED Notes (Signed)
ECHO at bedside.

## 2021-08-27 NOTE — Progress Notes (Signed)
ANTICOAGULATION CONSULT NOTE   Pharmacy Consult for heparin infusion Indication: ACS/STEMI  Allergies  Allergen Reactions   Humira [Adalimumab]    Iodine    Latex     Patient Measurements: Height: 5' (152.4 cm) Weight: 80.7 kg (178 lb) IBW/kg (Calculated) : 45.5 Heparin Dosing Weight: 64 kg  Vital Signs: BP: 118/75 (08/29 0700) Pulse Rate: 67 (08/29 0700)  Labs: Recent Labs    08/26/21 1936 08/26/21 2130 08/27/21 0337 08/27/21 0633  HGB 9.2*  --   --   --   HCT 27.9*  --   --   --   PLT 67*  --   --   --   APTT  --  20* >160* 152*  LABPROT  --  13.3  --   --   INR  --  1.0  --   --   HEPARINUNFRC  --   --  1.01*  --   CREATININE 1.87*  --   --   --   TROPONINIHS 170* 189* 203*  --      Estimated Creatinine Clearance: 30.5 mL/min (A) (by C-G formula based on SCr of 1.87 mg/dL (H)).   Medical History: History reviewed. No pertinent past medical history.  Medications:  PTA Med: Apixaban 2.5 mg BID  Assessment: Pt is 60 yo female w/ hx or DVT, presenting to ED due to dizziness and general malaise found with elevated Troponin I, trending up.  Date Time aPTT Rate/comment 8/29 0633 152 800 mL/hr, supratherapeutic    Goal of Therapy:  Heparin level 0.3-0.7 units/ml Monitor platelets by anticoagulation protocol: Yes Target aPTT 66-102   Plan:  aPTT supratherapeutic Hold heparin infusion for 1 hour then resume at reduced rate of 650 units/hr Due to DOAC PTA, will follow aPTT until correlation with HL Check aPTT in 6 hrs after rate change Daily CBC while on heparin.  Murphy Bundick L Ariella Voit,PharmD 08/27/2021 8:14 AM

## 2021-08-27 NOTE — ED Notes (Signed)
Patient transported to MRI 

## 2021-08-27 NOTE — ED Notes (Signed)
Dr. Sherryll Burger and Family at bedside

## 2021-08-27 NOTE — Plan of Care (Signed)
  Problem: Education: Goal: Knowledge of General Education information will improve Description: Including pain rating scale, medication(s)/side effects and non-pharmacologic comfort measures 08/27/2021 1742 by Ansel Bong, RN Outcome: Progressing 08/27/2021 1741 by Ansel Bong, RN Outcome: Progressing   Problem: Health Behavior/Discharge Planning: Goal: Ability to manage health-related needs will improve 08/27/2021 1742 by Ansel Bong, RN Outcome: Progressing 08/27/2021 1741 by Ansel Bong, RN Outcome: Progressing   Problem: Clinical Measurements: Goal: Ability to maintain clinical measurements within normal limits will improve 08/27/2021 1742 by Ansel Bong, RN Outcome: Progressing 08/27/2021 1741 by Ansel Bong, RN Outcome: Progressing Goal: Will remain free from infection 08/27/2021 1742 by Ansel Bong, RN Outcome: Progressing 08/27/2021 1741 by Ansel Bong, RN Outcome: Progressing Goal: Diagnostic test results will improve 08/27/2021 1742 by Ansel Bong, RN Outcome: Progressing 08/27/2021 1741 by Ansel Bong, RN Outcome: Progressing Goal: Respiratory complications will improve 08/27/2021 1742 by Ansel Bong, RN Outcome: Progressing 08/27/2021 1741 by Ansel Bong, RN Outcome: Progressing Goal: Cardiovascular complication will be avoided 08/27/2021 1742 by Ansel Bong, RN Outcome: Progressing 08/27/2021 1741 by Ansel Bong, RN Outcome: Progressing   Problem: Activity: Goal: Risk for activity intolerance will decrease 08/27/2021 1742 by Ansel Bong, RN Outcome: Progressing 08/27/2021 1741 by Ansel Bong, RN Outcome: Progressing   Problem: Nutrition: Goal: Adequate nutrition will be maintained 08/27/2021 1742 by Ansel Bong, RN Outcome: Progressing 08/27/2021 1741 by Ansel Bong, RN Outcome: Progressing   Problem: Coping: Goal: Level of anxiety will decrease 08/27/2021 1742 by Ansel Bong, RN Outcome:  Progressing 08/27/2021 1741 by Ansel Bong, RN Outcome: Progressing   Problem: Elimination: Goal: Will not experience complications related to bowel motility 08/27/2021 1742 by Ansel Bong, RN Outcome: Progressing 08/27/2021 1741 by Ansel Bong, RN Outcome: Progressing Goal: Will not experience complications related to urinary retention 08/27/2021 1742 by Ansel Bong, RN Outcome: Progressing 08/27/2021 1741 by Ansel Bong, RN Outcome: Progressing   Problem: Pain Managment: Goal: General experience of comfort will improve 08/27/2021 1742 by Ansel Bong, RN Outcome: Progressing 08/27/2021 1741 by Ansel Bong, RN Outcome: Progressing   Problem: Safety: Goal: Ability to remain free from injury will improve 08/27/2021 1742 by Ansel Bong, RN Outcome: Progressing 08/27/2021 1741 by Ansel Bong, RN Outcome: Progressing   Problem: Skin Integrity: Goal: Risk for impaired skin integrity will decrease 08/27/2021 1742 by Ansel Bong, RN Outcome: Progressing 08/27/2021 1741 by Ansel Bong, RN Outcome: Progressing

## 2021-08-27 NOTE — ED Notes (Signed)
Per pharmacy patient heparin gtt to hold for 1 hour and restart at 650unit/hr

## 2021-08-27 NOTE — ED Notes (Signed)
Patient assisted to bedside commode, minimal assist, UA collected. Patient provided with hygiene kit.  

## 2021-08-27 NOTE — Consult Note (Signed)
Neurology Consultation Reason for Consult: Possible seizure Referring Physician: Karlene Lineman  CC: Possible seizure  History is obtained from: Patient, son  HPI: Tammy Hardin is a 60 y.o. female with a history of amyloid heart disease, frequent presyncope and syncope due to her heart disease as well as rheumatoid arthritis who presents with transient episode of right arm stiffening.  She was sitting at the time when her son states that her right arm and leg both stiffened.  Her left side was not affected.  He asked her question and she mumbled something incoherent, but was not speaking clearly.  Her arm remained stiff for approximately 1 minute, following which she stated that she needed to vomit and then did so.  She is amnestic to this event.  She denies any previous episode of motor activity such as this, but she does have a history of syncope.  She describes this as preceded by lightheaded and ringing in her ears, following which she has lost consciousness.  These have not been witnessed, so unclear if there was any focal motor activity with them.  She was diagnosed with Bell's palsy in the past, and has had a "lazy eye" since then.   ROS: A 14 point ROS was performed and is negative except as noted in the HPI.   Past Medical History:  Diagnosis Date   (HFpEF) heart failure with preserved ejection fraction (HCC)    Amyloid disease (HCC)    Anemia of chronic disease    Bell's palsy    Cardiac amyloidosis (HCC)    Cholecystitis    Chronic kidney disease (CKD), stage III (moderate) (HCC)    History of DVT (deep vein thrombosis)    Methotrexate, long term, current use    Nephrotic syndrome    RA (rheumatoid arthritis) (HCC)    Syncope      Family History  Problem Relation Age of Onset   Hypertension Mother    Arthritis Mother    Diabetes Mother    Heart disease Mother    Arthritis Father    Hypertension Father    Diabetes Father    Heart disease Father      Social History:   reports that she has never smoked. She has never used smokeless tobacco. No history on file for alcohol use and drug use.   Exam: Current vital signs: BP 134/82 (BP Location: Right Arm)   Pulse 79   Temp 97.6 F (36.4 C)   Resp 17   Ht 5' (1.524 m)   Wt 80.7 kg   SpO2 100%   BMI 34.76 kg/m  Vital signs in last 24 hours: Temp:  [97.6 F (36.4 C)-98.5 F (36.9 C)] 97.6 F (36.4 C) (08/29 1720) Pulse Rate:  [67-87] 79 (08/29 1720) Resp:  [12-29] 17 (08/29 1720) BP: (90-138)/(61-82) 134/82 (08/29 1720) SpO2:  [97 %-100 %] 100 % (08/29 1720) Weight:  [80.7 kg] 80.7 kg (08/28 1930)   Physical Exam  Constitutional: Appears well-developed and well-nourished.  Psych: Affect appropriate to situation Eyes: No scleral injection HENT: No OP obstruction MSK: no joint deformities.  Cardiovascular: Normal rate and regular rhythm.  Respiratory: Effort normal, non-labored breathing GI: Soft.  No distension. There is no tenderness.  Skin: WDI  Neuro: Mental Status: Patient is awake, alert, oriented to person, place, month, year, and situation. Patient is able to give a clear and coherent history. No signs of aphasia or neglect Cranial Nerves: II: Visual Fields are full. Pupils are equal, round, and reactive  to light.   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 XII: tongue is midline without atrophy or fasciculations.  Motor: Tone is normal. Bulk is normal. 5/5 strength was present in all four extremities.  Sensory: Sensation is symmetric to light touch and temperature in the arms and legs. Cerebellar: No clear  ataxia     I have reviewed labs in epic and the results pertinent to this consultation are: GFR 31 Sodium 138 Mild leukopenia at 3.0  I have reviewed the images obtained: MRI brain-no clear acute findings  Impression: Tammy Hardin is a very complicated, very nice 60 year old female with a history of AL amyloidosis on chemotherapy with daratumumab, cyclophosphamide, bortezomib, dexamethasone.  Cranial nerve involvement with amyloidosis is fairly rare, but has been reported.  I am not sure even if we were to prove cranial nerve involvement in this disease if we would modify our therapy given that she is already on what appears to be a fairly aggressive chemotherapeutic regimen.  Rheumatoid arthritis can cause an aseptic meningitis with meningeal thickening which does not necessarily need to be associated with headache, but can cause focal seizures as well as cranial neuropathies.  Further imaging with contrasted MRI could be exceedingly helpful.  Microvascular 3rd nerve palsy as well as idiopathic Bell's palsy are also possible, but given the presence of multiple cranial nerve deficits in the patient with complicated hematological and rheumatological disease, I think involvement of these diseases needs to be considered. Aneurysm is less likley, but will get MRA.   Seizure and cranial nerve palsy have been reported with bortezomib. Seizure has been reported with cyclophosphamide, but I think this is most often in conjunction with PRES, which is not demonstrated on MRI.   With first seizure, I do not always start AEDs, but do think that EEG should be performed and I have a low threshold for starting prior to discharge.   Recommendations: 1) EEG 2) Contrasted MRI brain 3) MRA head 4) Will continue to follow.    Ritta Slot, MD [facility] [phone]  If 7pm- 7am, please page neurology on call as listed in AMION.

## 2021-08-27 NOTE — Evaluation (Signed)
Physical Therapy Evaluation Patient Details Name: Tammy Hardin MRN: [id number] DOB: [birth year] Today's Date: 08/27/2021   History of Present Illness  Tammy Hardin is 60 year old female with a known history of disseminated amyloidosis including cardiac and kidney; history of DVT on Eliquis; rheumatoid arthritis; chronically elevated troponins; anemia of chronic disease; CKD stage IIIa, Bell's palsy. Brought to ED for presyncope/syncope and chest pain.  Clinical Impression  Pt is a pleasant 60 year old female who was admitted for dizziness and weakness. Pt performs bed mobility/transfers with cga and ambulation with min assist and RW. Becomes dizzy with exertion with hyperventilation and weakness present. Pt demonstrates deficits with strength/balance/endurance. Pt is currently not at baseline level. Would benefit from skilled PT to address above deficits and promote optimal return to PLOF; recommend transition to STR upon discharge from acute hospitalization.  Orthostatics: Supine: 90/61 Sit: 86/57 Stand: 97/74 +dizzy Return to supine: 114/70     Follow Up Recommendations SNF (hoping to dc home with son pending PT progress)    Equipment Recommendations  None recommended by PT    Recommendations for Other Services       Precautions / Restrictions Precautions Precautions: None Restrictions Weight Bearing Restrictions: No      Mobility  Bed Mobility Overal bed mobility: Needs Assistance Bed Mobility: Supine to Sit     Supine to sit: Min guard Sit to supine: Min assist   General bed mobility comments: slight assist for B LEs. Once seated at EOB, no dizziness noted. Assist required for positioning    Transfers Overall transfer level: Needs assistance Equipment used: Rolling walker (2 wheeled) Transfers: Sit to/from Stand Sit to Stand: Min guard         General transfer comment: dizziness with change in position. Once standing feels steady with RW. Only able to  tolerate minimal challenge to balance. Fatigues quickly with hyperventilation noted. BP taken for orthostatics  Ambulation/Gait Ambulation/Gait assistance: Min assist Gait Distance (Feet): 2 Feet Assistive device: Rolling walker (2 wheeled) Gait Pattern/deviations: Step-to pattern     General Gait Details: cautious steps at bedside using RW. Short step to gait. Dizziness preventing further mobility efforts  Stairs            Wheelchair Mobility    Modified Rankin (Stroke Patients Only)       Balance Overall balance assessment: History of Falls;Needs assistance Sitting-balance support: Feet unsupported;Single extremity supported Sitting balance-Leahy Scale: Good     Standing balance support: Bilateral upper extremity supported Standing balance-Leahy Scale: Fair Standing balance comment: B UE use on RW                             Pertinent Vitals/Pain Pain Assessment: No/denies pain    Home Living Family/patient expects to be discharged to:: Private residence Living Arrangements: Alone Available Help at Discharge: Family;Available PRN/intermittently Type of Home: Mobile home Home Access: Stairs to enter Entrance Stairs-Rails: Can reach both Entrance Stairs-Number of Steps: 6 Home Layout: One level Home Equipment: Walker - 2 wheels;Tub bench;Cane - single point;Walker - 4 wheels Additional Comments: at time of discharge, planning to dc with son-home layout= 1 story with 5 steps to enter with B rails    Prior Function Level of Independence: Independent with assistive device(s)         Comments: previously indep, however recently using rollater in the last day due to dizziness. Reports + falls.     Hand Dominance  Extremity/Trunk Assessment   Upper Extremity Assessment Upper Extremity Assessment: Overall WFL for tasks assessed    Lower Extremity Assessment Lower Extremity Assessment: Generalized weakness (B LE grossly 4/5)        Communication   Communication: No difficulties  Cognition Arousal/Alertness: Awake/alert Behavior During Therapy: WFL for tasks assessed/performed Overall Cognitive Status: Within Functional Limits for tasks assessed                                        General Comments      Exercises Other Exercises Other Exercises: Bed mobility, self feeding, dressing, transfers, toileting. Educ re: role of OT, POC, DC options.   Assessment/Plan    PT Assessment Patient needs continued PT services  PT Problem List Decreased strength;Decreased activity tolerance;Decreased balance;Decreased mobility       PT Treatment Interventions DME instruction;Gait training;Stair training;Therapeutic exercise;Balance training    PT Goals (Current goals can be found in the Care Plan section)  Acute Rehab PT Goals Patient Stated Goal: to have more energy PT Goal Formulation: With patient Time For Goal Achievement: 09/10/21 Potential to Achieve Goals: Good    Frequency Min 2X/week   Barriers to discharge        Co-evaluation               AM-PAC PT "6 Clicks" Mobility  Outcome Measure Help needed turning from your back to your side while in a flat bed without using bedrails?: A Little Help needed moving from lying on your back to sitting on the side of a flat bed without using bedrails?: A Little Help needed moving to and from a bed to a chair (including a wheelchair)?: A Little Help needed standing up from a chair using your arms (e.g., wheelchair or bedside chair)?: A Little Help needed to walk in hospital room?: A Lot Help needed climbing 3-5 steps with a railing? : Total 6 Click Score: 15    End of Session   Activity Tolerance: Patient tolerated treatment well Patient left: in bed Nurse Communication: Mobility status PT Visit Diagnosis: Unsteadiness on feet (R26.81);Muscle weakness (generalized) (M62.81);History of falling (Z91.81);Difficulty in walking, not  elsewhere classified (R26.2);Dizziness and giddiness (R42)    Time: 3128-1188 PT Time Calculation (min) (ACUTE ONLY): 24 min   Charges:   PT Evaluation $PT Eval Low Complexity: 1 Low PT Treatments $Therapeutic Activity: 8-22 mins        Tammy Hardin, PT, DPT [phone]   Tammy Hardin 08/27/2021, 4:26 PM

## 2021-08-27 NOTE — ED Notes (Signed)
PT at bedside.

## 2021-08-27 NOTE — ED Notes (Signed)
OT at bedside. 

## 2021-08-27 NOTE — Progress Notes (Signed)
*  PRELIMINARY RESULTS* Echocardiogram 2D Echocardiogram has been performed.  Tammy Hardin 08/27/2021, 11:17 AM

## 2021-08-27 NOTE — Progress Notes (Signed)
1       [facility]   PATIENT NAME: Tammy Hardin    MR#:  [id number]  PCP: Reather Converse, MD  DATE OF BIRTH:  [birth year]  SUBJECTIVE:  CHIEF COMPLAINT:   Chief Complaint  Patient presents with  . Loss of Consciousness  Reports feeling dizzy on postural changes or activity REVIEW OF SYSTEMS:  Review of Systems  Constitutional:  Negative for diaphoresis, fever, malaise/fatigue and weight loss.  HENT:  Negative for ear discharge, ear pain, hearing loss, nosebleeds, sore throat and tinnitus.   Eyes:  Negative for blurred vision and pain.  Respiratory:  Negative for cough, hemoptysis, shortness of breath and wheezing.   Cardiovascular:  Negative for chest pain, palpitations, orthopnea and leg swelling.  Gastrointestinal:  Negative for abdominal pain, blood in stool, constipation, diarrhea, heartburn, nausea and vomiting.  Genitourinary:  Negative for dysuria, frequency and urgency.  Musculoskeletal:  Negative for back pain and myalgias.  Skin:  Negative for itching and rash.  Neurological:  Positive for dizziness. Negative for tingling, tremors, focal weakness, seizures, weakness and headaches.  Psychiatric/Behavioral:  Negative for depression. The patient is not nervous/anxious.   DRUG ALLERGIES:   Allergies  Allergen Reactions  . Humira [Adalimumab]   . Iodine   . Latex    VITALS:  Blood pressure 125/81, pulse 76, temperature 98.5 F (36.9 C), temperature source Oral, resp. rate 17, height 5' (1.524 m), weight 80.7 kg, SpO2 100 %. PHYSICAL EXAMINATION:  Physical Exam 60 year old female lying in the bed comfortably without any acute distress Eyes pupil equal round reactive to light and accommodation, no scleral icterus Cardiovascular S1-S2 normal, no murmur rales or gallop Lungs clear to auscultation bilaterally no wheezing rales rhonchi or crepitation Abdomen soft, benign Neuro nonfocal, alert and oriented.  Left facial asymmetry from chronic  Bell's palsy Skin no rash or lesion LABORATORY PANEL:  Female CBC Recent Labs  Lab 08/27/21 1257  WBC 2.3*  HGB 7.4*  HCT 21.8*  PLT 55*   ------------------------------------------------------------------------------------------------------------------ Chemistries  Recent Labs  Lab 08/26/21 1936 08/26/21 2130 08/27/21 1257  NA 138  --   --   K 3.3*  --   --   CL 107  --   --   CO2 19*  --   --   GLUCOSE 100*  --   --   BUN 22*  --   --   CREATININE 1.87*  --  1.35*  CALCIUM 8.1*  --   --   MG  --  1.6*  --   AST 15  --   --   ALT 10  --   --   ALKPHOS 49  --   --   BILITOT 1.1  --   --    MEDICATIONS:  Scheduled Meds: . cholecalciferol  5,000 Units Oral Daily  . heparin  5,000 Units Subcutaneous Q8H  . loratadine  10 mg Oral Daily  . pyridOXINE  25 mg Oral Daily  . valACYclovir  500 mg Oral Daily  . vitamin B-12  1,000 mcg Oral Daily   Continuous Infusions: . sodium chloride 75 mL/hr at 08/27/21 0242   RADIOLOGY:  CT HEAD WO CONTRAST ( )  Result Date: 08/26/2021 CLINICAL DATA:  Possible seizure and syncope. EXAM: CT HEAD WITHOUT CONTRAST TECHNIQUE: Contiguous axial images were obtained from the base of the skull through the vertex without intravenous contrast. COMPARISON:  None. FINDINGS: Brain: No evidence of acute infarction, hemorrhage, hydrocephalus, extra-axial collection or mass  lesion/mass effect. Vascular: No hyperdense vessel or unexpected calcification. Skull: Normal. Negative for fracture or focal lesion. Sinuses/Orbits: No acute finding. Other: None. IMPRESSION: No acute intracranial pathology. Electronically Signed   By: Aram Candela M.D.   On: 08/26/2021 21:59   DG Chest Portable 1 View  Result Date: 08/26/2021 CLINICAL DATA:  Possible MI. Syncope versus shaking. Evaluate for infiltrate. EXAM: PORTABLE CHEST 1 VIEW COMPARISON:  None. FINDINGS: No pneumothorax. The heart, hila, and mediastinum are normal. No pulmonary nodules or masses. No focal  infiltrates. IMPRESSION: No active disease. Electronically Signed   By: Gerome Sam III M.D.   On: 08/26/2021 21:37   ECHOCARDIOGRAM COMPLETE  Result Date: 08/27/2021    ECHOCARDIOGRAM REPORT   Patient Name:   Tammy Hardin Date of Exam: 08/27/2021 Medical Rec #:  [id number]      Height:       60.0 in Accession #:    [id number]     Weight:       178.0 lb Date of Birth:  [birth year]       BSA:          1.776 m Patient Age:    60 years       BP:           118/75 mmHg Patient Gender: F              HR:           80 bpm. Exam Location:  [facility] Procedure: 2D Echo, Color Doppler, Cardiac Doppler and Intracardiac            Opacification Agent Indications:     R07.9 Chest Pain  History:         Patient has no prior history of Echocardiogram examinations.                  Cardiac amyloidosis, CKD; Signs/Symptoms:Syncope. Hx of DVT.  Sonographer:     Humphrey Rolls Referring Phys:  [id number] Rometta Emery Diagnosing Phys: Lorine Bears MD  Sonographer Comments: Technically difficult study due to poor echo windows and no subcostal window. IMPRESSIONS  1. Left ventricular ejection fraction, by estimation, is 60 to 65%. The left ventricle has normal function. The left ventricle has no regional wall motion abnormalities. There is moderate concentric left ventricular hypertrophy. Left ventricular diastolic parameters are indeterminate.  2. Right ventricular systolic function is normal. The right ventricular size is normal. Tricuspid regurgitation signal is inadequate for assessing PA pressure.  3. Left atrial size was mildly dilated.  4. The mitral valve is normal in structure. No evidence of mitral valve regurgitation. No evidence of mitral stenosis.  5. The aortic valve is normal in structure. Aortic valve regurgitation is not visualized. No aortic stenosis is present. FINDINGS  Left Ventricle: Left ventricular ejection fraction, by estimation, is 60 to 65%. The left ventricle has normal function. The left ventricle has no  regional wall motion abnormalities. Definity contrast agent was given IV to delineate the left ventricular  endocardial borders. The left ventricular internal cavity size was normal in size. There is moderate concentric left ventricular hypertrophy. Left ventricular diastolic parameters are indeterminate. Right Ventricle: The right ventricular size is normal. No increase in right ventricular wall thickness. Right ventricular systolic function is normal. Tricuspid regurgitation signal is inadequate for assessing PA pressure. Left Atrium: Left atrial size was mildly dilated. Right Atrium: Right atrial size was normal in size. Pericardium: There is no evidence of pericardial effusion. Mitral Valve: The mitral  valve is normal in structure. No evidence of mitral valve regurgitation. No evidence of mitral valve stenosis. MV peak gradient, 3.8 mmHg. The mean mitral valve gradient is 1.0 mmHg. Tricuspid Valve: The tricuspid valve is normal in structure. Tricuspid valve regurgitation is not demonstrated. No evidence of tricuspid stenosis. Aortic Valve: The aortic valve is normal in structure. Aortic valve regurgitation is not visualized. No aortic stenosis is present. Aortic valve mean gradient measures 3.0 mmHg. Aortic valve peak gradient measures 5.9 mmHg. Aortic valve area, by VTI measures 2.84 cm. Pulmonic Valve: The pulmonic valve was normal in structure. Pulmonic valve regurgitation is not visualized. No evidence of pulmonic stenosis. Aorta: The aortic root is normal in size and structure. Venous: The inferior vena cava was not well visualized. IAS/Shunts: No atrial level shunt detected by color flow Doppler.  LEFT VENTRICLE PLAX 2D LVIDd:         3.50 cm  Diastology LVIDs:         2.30 cm  LV e' medial:    3.70 cm/s LV PW:         1.20 cm  LV E/e' medial:  19.8 LV IVS:        0.90 cm  LV e' lateral:   3.15 cm/s LVOT diam:     2.00 cm  LV E/e' lateral: 23.3 LV SV:         57 LV SV Index:   32 LVOT Area:     3.14 cm   RIGHT VENTRICLE RV Basal diam:  2.40 cm LEFT ATRIUM             Index       RIGHT ATRIUM           Index LA diam:        3.20 cm 1.80 cm/m  RA Area:     10.30 cm LA Vol (A2C):   52.3 ml 29.44 ml/m RA Volume:   20.40 ml  11.48 ml/m LA Vol (A4C):   50.1 ml 28.20 ml/m LA Biplane Vol: 56.8 ml 31.97 ml/m  AORTIC VALVE                   PULMONIC VALVE AV Area (Vmax):    2.86 cm    PV Vmax:       1.45 m/s AV Area (Vmean):   2.86 cm    PV Vmean:      88.500 cm/s AV Area (VTI):     2.84 cm    PV VTI:        0.250 m AV Vmax:           121.00 cm/s PV Peak grad:  8.4 mmHg AV Vmean:          80.800 cm/s PV Mean grad:  4.0 mmHg AV VTI:            0.199 m AV Peak Grad:      5.9 mmHg AV Mean Grad:      3.0 mmHg LVOT Vmax:         110.00 cm/s LVOT Vmean:        73.600 cm/s LVOT VTI:          0.180 m LVOT/AV VTI ratio: 0.90  AORTA Ao Root diam: 3.30 cm MITRAL VALVE MV Area (PHT): 5.97 cm    SHUNTS MV Area VTI:   3.27 cm    Systemic VTI:  0.18 m MV Peak grad:  3.8 mmHg    Systemic Diam: 2.00 cm  MV Mean grad:  1.0 mmHg MV Vmax:       0.97 m/s MV Vmean:      57.1 cm/s MV Decel Time: 127 msec MV E velocity: 73.30 cm/s MV A velocity: 85.90 cm/s MV E/A ratio:  0.85 Lorine Bears MD Electronically signed by Lorine Bears MD Signature Date/Time: 08/27/2021/1:19:44 PM    Final    ASSESSMENT AND PLAN:  60 year old female with a known history of disseminated amyloidosis including cardiac, kidney, history of DVT on Eliquis, rheumatoid arthritis, chronically elevated troponins, anemia of chronic disease, CKD stage IIIa, history of orthostasis with recurrent presyncope/syncope followed by [facility] cardiology, Bell's palsy is admitted for presyncope/syncope  8/29: Cardiology and neurology consultation.  MRI of the brain.  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)   Hypertension   Patient is Jehovah's Witness   Rheumatoid arthritis (HCC)   Troponin level elevated   Pancytopenia (HCC)   Hypomagnesemia   AKI (acute kidney injury) (HCC)   Demand ischemia (HCC)  Presyncope/syncope Likely due to orthostatic hypotension with possible autonomic dysfunction/malnutrition -We will request cardiology and neurology input -PT, OT, dietitian consult Underlying amyloidosis could be contributing  Elevated troponin She does have chronically elevated troponin looking back at her [facility] cardiology note in Care Everywhere - due to demand ischemia.  No MI.  She is not reporting any chest pain or dyspnea Will obtain 2D echo and consult cardiology.  Discontinue heparin drip Monitor on telemetry  Disseminated amyloidosis With involvement of stomach, heart, fascial muscles getting weekly infusion/every Monday at [facility] for treatment  AKI with underlying CKD stage IIIa Likely prerenal with dehydration and poor p.o. intake Improving with hydration.  Kidney function close to baseline now CKD likely from amyloidosis  History of DVT On Eliquis  Anemia of chronic kidney disease Hemoglobin 7.4.  No active signs of bleeding  Pancytopenia Likely due to underlying treatment for amyloidosis Transfuse blood if hemoglobin drops less than 7  Body mass index is 34.76 kg/m.  Net IO Since Admission: 1,424.18 mL [08/27/21 1320]      LOS: 1 day   Consultants: Neurology Cardiology   Status is: Inpatient  Remains inpatient appropriate because:Ongoing diagnostic testing needed not appropriate for outpatient work up  Dispo: The patient is from: Home              Anticipated d/c is to: Home              Patient currently is not medically stable to d/c.   Difficult to place patient No    DVT prophylaxis:       heparin injection 5,000 Units Start: 08/27/21 1400     Family  Communication: (Updated son and daughter at bedside)   All the records are reviewed and case discussed with Nursing and TOC team. Management plans discussed with the patient, family and they are in agreement.  CODE STATUS: Full Code Level of care: Med-Surg  TOTAL TIME TAKING CARE OF THIS PATIENT: 35 minutes.   More than 50% of the time was spent in counseling/coordination of care: YES  POSSIBLE D/C IN 1-2 DAYS, DEPENDING ON CLINICAL CONDITION.   Delfino Lovett M.D on 08/27/2021 at 1:20 PM  [facility]   CC: Primary care physician; Reather Converse,  MD  Note: This dictation was prepared with Dragon dictation along with smaller phrase technology. Any transcriptional errors that result from this process are unintentional.

## 2021-08-27 NOTE — Hospital Course (Addendum)
60 year old female with a known history of disseminated amyloidosis including cardiac, kidney, history of DVT on Eliquis, rheumatoid arthritis, chronically elevated troponins, anemia of chronic disease, CKD stage IIIa, history of orthostasis with recurrent presyncope/syncope followed by [facility] cardiology, Bell's palsy is admitted for presyncope/syncope  8/29: Cardiology and neurology consultation.  MRI of the brain.  Checking orthostatics, PT, OT, dietitian eval 8/30: Patient still very symptomatic and orthostatic.  Midodrine started.  Negative neuro work-up.

## 2021-08-27 NOTE — Evaluation (Signed)
Occupational Therapy Evaluation Patient Details Name: Geryl Dohn MRN: [id number] DOB: Sep 18, 1961 Today's Date: 08/27/2021    History of Present Illness Darbie Biancardi is 60 year old female with a known history of disseminated amyloidosis including cardiac and kidney; history of DVT on Eliquis; rheumatoid arthritis; chronically elevated troponins; anemia of chronic disease; CKD stage IIIa, Bell's palsy. Brought to ED for presyncope/syncope and chest pain.   Clinical Impression   Ms. Bortner presents today with generalized weakness, limited endurance, ringing in ears, and lightheadedness with standing. Prior to admission, she had been living alone in a 1-story home, 6 STE, driving, going to [facility] for chemotherapy tx for amyloidosis since May 2022. She reports that lately (w/in ~ last month) she has no longer been able to manage the IADL she used to do for herself, such as shopping, cooking, cleaning. She reports increasing levels of fatigue, dizziness, as well as 2 falls in previous 6 months. Upon evaluation today, pt displays ability to transition supine<sit w/o physical assistance; however, requires Min A for elevating LE up to bed level with sit<supine. Able to come into standing and ambulate w/in room, perform toileting on BSC, with close SUPV, 2/2 dizziness and several small LOB episodes (pt describes herself as having "the wobbles"). Recommend ongoing OT during hospitalization. At this point, due to pt's weakness, dizziness, fatigue, would not be comfortable with her going home alone. Pt's son and daughter present throughout session and insisting that pt come live with one of them. Their plan at this time is for pt to move to son's home temporarily, with HHOT, until she is able to return to PLOF.    Follow Up Recommendations  Home health OT;Supervision/Assistance - 24 hour    Equipment Recommendations       Recommendations for Other Services       Precautions / Restrictions  Precautions Precautions: None Restrictions Weight Bearing Restrictions: No      Mobility Bed Mobility Overal bed mobility: Needs Assistance Bed Mobility: Sit to Supine;Supine to Sit     Supine to sit: Modified independent (Device/Increase time) Sit to supine: Min assist   General bed mobility comments: requires MinA elevating b/l LE onto bed    Transfers Overall transfer level: Needs assistance Equipment used: 1 person hand held assist Transfers: Sit to/from Stand Sit to Stand: Min guard         General transfer comment: reports dizzyiness with standing    Balance Overall balance assessment: Needs assistance Sitting-balance support: Feet unsupported;Single extremity supported Sitting balance-Leahy Scale: Good     Standing balance support: No upper extremity supported Standing balance-Leahy Scale: Fair                             ADL either performed or assessed with clinical judgement   ADL Overall ADL's : Needs assistance/impaired Eating/Feeding: Independent                   Lower Body Dressing: Moderate assistance Lower Body Dressing Details (indicate cue type and reason): donning socks Toilet Transfer: BSC;Min Manufacturing systems engineer Details (indicate cue type and reason): close supv for safety during transfer, given pt report of light-headedness Toileting- Clothing Manipulation and Hygiene: Min guard               Vision Baseline Vision/History:  (Bells palsy, L eye droop) Patient Visual Report: No change from baseline       Perception     Praxis  Pertinent Vitals/Pain Pain Assessment: No/denies pain     Hand Dominance     Extremity/Trunk Assessment Upper Extremity Assessment Upper Extremity Assessment: Overall WFL for tasks assessed   Lower Extremity Assessment Lower Extremity Assessment: Overall WFL for tasks assessed       Communication Communication Communication: No difficulties   Cognition  Arousal/Alertness: Awake/alert Behavior During Therapy: WFL for tasks assessed/performed Overall Cognitive Status: Within Functional Limits for tasks assessed                                     General Comments       Exercises Other Exercises Other Exercises: Bed mobility, self feeding, dressing, transfers, toileting. Educ re: role of OT, POC, DC options.   Shoulder Instructions      Home Living Family/patient expects to be discharged to:: Private residence Living Arrangements: Alone Available Help at Discharge: Family;Available PRN/intermittently Type of Home: Mobile home Home Access: Stairs to enter Entrance Stairs-Number of Steps: 5   Home Layout: One level               Home Equipment: Walker - 2 wheels;Tub bench;Cane - single point (rollator)          Prior Functioning/Environment Level of Independence: Independent with assistive device(s)                 OT Problem List: Decreased strength;Impaired balance (sitting and/or standing);Decreased activity tolerance;Decreased knowledge of use of DME or AE      OT Treatment/Interventions: Self-care/ADL training;DME and/or AE instruction;Therapeutic activities;Balance training;Therapeutic exercise;Energy conservation;Patient/family education    OT Goals(Current goals can be found in the care plan section) Acute Rehab OT Goals Patient Stated Goal: to have more energy OT Goal Formulation: With patient Time For Goal Achievement: 09/10/21 Potential to Achieve Goals: Good ADL Goals Pt Will Transfer to Toilet: with supervision;stand pivot transfer (using LRAD) Additional ADL Goal #1: Pt will be able to identify/demonstrate 2+ falls prevention strategies. Additional ADL Goal #2: Pt will be able to identify/demonstrate 2+ energy conservation techniques  OT Frequency: Min 1X/week   Barriers to D/C: Decreased caregiver support          Co-evaluation              AM-PAC OT "6 Clicks" Daily  Activity     Outcome Measure Help from another person eating meals?: None Help from another person taking care of personal grooming?: A Little Help from another person toileting, which includes using toliet, bedpan, or urinal?: A Little Help from another person bathing (including washing, rinsing, drying)?: A Little Help from another person to put on and taking off regular upper body clothing?: A Little Help from another person to put on and taking off regular lower body clothing?: A Lot 6 Click Score: 18   End of Session    Activity Tolerance: Patient tolerated treatment well Patient left: in bed;with family/visitor present;with call bell/phone within reach;Other (comment) (MD in room)  OT Visit Diagnosis: Unsteadiness on feet (R26.81);Muscle weakness (generalized) (M62.81);Dizziness and giddiness (R42)                Time: 8786-7672 OT Time Calculation (min): 22 min Charges:  OT General Charges $OT Visit: 1 Visit OT Evaluation $OT Eval Moderate Complexity: 1 Mod OT Treatments $Self Care/Home Management : 8-22 mins Latina Craver, PhD, MS, OTR/L 08/27/21, 3:22 PM

## 2021-08-28 DIAGNOSIS — E8581 Light chain (AL) amyloidosis: Secondary | ICD-10-CM | POA: Diagnosis not present

## 2021-08-28 DIAGNOSIS — E440 Moderate protein-calorie malnutrition: Secondary | ICD-10-CM | POA: Diagnosis present

## 2021-08-28 DIAGNOSIS — R42 Dizziness and giddiness: Secondary | ICD-10-CM | POA: Diagnosis not present

## 2021-08-28 DIAGNOSIS — E854 Organ-limited amyloidosis: Secondary | ICD-10-CM | POA: Diagnosis not present

## 2021-08-28 DIAGNOSIS — R55 Syncope and collapse: Secondary | ICD-10-CM | POA: Diagnosis not present

## 2021-08-28 DIAGNOSIS — N179 Acute kidney failure, unspecified: Secondary | ICD-10-CM | POA: Diagnosis not present

## 2021-08-28 LAB — BASIC METABOLIC PANEL
Anion gap: 11 (ref 5–15)
BUN: 22 mg/dL — ABNORMAL HIGH (ref 6–20)
CO2: 22 mmol/L (ref 22–32)
Calcium: 8.2 mg/dL — ABNORMAL LOW (ref 8.9–10.3)
Chloride: 103 mmol/L (ref 98–111)
Creatinine, Ser: 1.76 mg/dL — ABNORMAL HIGH (ref 0.44–1.00)
GFR, Estimated: 33 mL/min — ABNORMAL LOW (ref >60)
Glucose, Bld: 74 mg/dL (ref 70–99)
Potassium: 3.1 mmol/L — ABNORMAL LOW (ref 3.5–5.1)
Sodium: 136 mmol/L (ref 135–145)

## 2021-08-28 LAB — CBC
HCT: 24 % — ABNORMAL LOW (ref 36.0–46.0)
Hemoglobin: 8.4 g/dL — ABNORMAL LOW (ref 12.0–15.0)
MCH: 33.7 pg (ref 26.0–34.0)
MCHC: 35 g/dL (ref 30.0–36.0)
MCV: 96.4 fL (ref 80.0–100.0)
Platelets: 65 10*3/uL — ABNORMAL LOW (ref 150–400)
RBC: 2.49 MIL/uL — ABNORMAL LOW (ref 3.87–5.11)
RDW: 16.5 % — ABNORMAL HIGH (ref 11.5–15.5)
WBC: 2 10*3/uL — ABNORMAL LOW (ref 4.0–10.5)
nRBC: 0 % (ref 0.0–0.2)

## 2021-08-28 LAB — MAGNESIUM: Magnesium: 2.1 mg/dL (ref 1.7–2.4)

## 2021-08-28 LAB — GLUCOSE, CAPILLARY: Glucose-Capillary: 81 mg/dL (ref 70–99)

## 2021-08-28 MED ORDER — POTASSIUM CHLORIDE CRYS ER 20 MEQ PO TBCR
40.0000 meq | EXTENDED_RELEASE_TABLET | Freq: Once | ORAL | Status: AC
  Administered 2021-08-28: 40 meq via ORAL
  Filled 2021-08-28: qty 2

## 2021-08-28 MED ORDER — ADULT MULTIVITAMIN W/MINERALS CH
1.0000 | ORAL_TABLET | Freq: Every day | ORAL | Status: DC
  Administered 2021-08-29 – 2021-08-31 (×3): 1 via ORAL
  Filled 2021-08-28 (×3): qty 1

## 2021-08-28 MED ORDER — ENSURE ENLIVE PO LIQD
237.0000 mL | Freq: Three times a day (TID) | ORAL | Status: DC
  Administered 2021-08-29 – 2021-08-31 (×4): 237 mL via ORAL

## 2021-08-28 MED ORDER — MIDODRINE HCL 5 MG PO TABS
5.0000 mg | ORAL_TABLET | Freq: Three times a day (TID) | ORAL | Status: DC
  Administered 2021-08-28 – 2021-08-30 (×6): 5 mg via ORAL
  Filled 2021-08-28 (×6): qty 1

## 2021-08-28 NOTE — Progress Notes (Addendum)
1       [place] at [facility]   PATIENT NAME: Tammy Hardin    MR#:  [id number]  PCP: Reather Converse, MD  DATE OF BIRTH:  08-04-1961  SUBJECTIVE:  CHIEF COMPLAINT:   Chief Complaint  Patient presents with  . Loss of Consciousness  Feeling very dizzy when try to work with mobility specialist this morning.  Blood pressure dropped as low as 61/41 while seated in the chair and improved to 93/61 when lower extremity elevated REVIEW OF SYSTEMS:  Review of Systems  Constitutional:  Negative for diaphoresis, fever, malaise/fatigue and weight loss.  HENT:  Negative for ear discharge, ear pain, hearing loss, nosebleeds, sore throat and tinnitus.   Eyes:  Negative for blurred vision and pain.  Respiratory:  Negative for cough, hemoptysis, shortness of breath and wheezing.   Cardiovascular:  Negative for chest pain, palpitations, orthopnea and leg swelling.  Gastrointestinal:  Negative for abdominal pain, blood in stool, constipation, diarrhea, heartburn, nausea and vomiting.  Genitourinary:  Negative for dysuria, frequency and urgency.  Musculoskeletal:  Negative for back pain and myalgias.  Skin:  Negative for itching and rash.  Neurological:  Positive for dizziness. Negative for tingling, tremors, focal weakness, seizures, weakness and headaches.  Psychiatric/Behavioral:  Negative for depression. The patient is not nervous/anxious.   DRUG ALLERGIES:   Allergies  Allergen Reactions  . Humira [Adalimumab]   . Iodine   . Latex    VITALS:  Blood pressure (!) 113/57, pulse 82, temperature 97.7 F (36.5 C), temperature source Oral, resp. rate 17, height 5' (1.524 m), weight 79 kg, SpO2 100 %. PHYSICAL EXAMINATION:  Physical Exam 60 year old female lying in the bed comfortably without any acute distress Eyes pupil equal round reactive to light and accommodation, no scleral icterus Cardiovascular S1-S2 normal, no murmur rales or gallop Lungs clear to auscultation  bilaterally no wheezing rales rhonchi or crepitation Abdomen soft, benign Neuro alert and oriented.  Left facial asymmetry from chronic Bell's palsy.  Impaired movement of the left eye Skin no rash or lesion LABORATORY PANEL:  Female CBC Recent Labs  Lab 08/28/21 0532  WBC 2.0*  HGB 8.4*  HCT 24.0*  PLT 65*   ------------------------------------------------------------------------------------------------------------------ Chemistries  Recent Labs  Lab 08/26/21 1936 08/26/21 2130 08/28/21 0532  NA 138  --  136  K 3.3*  --  3.1*  CL 107  --  103  CO2 19*  --  22  GLUCOSE 100*  --  74  BUN 22*  --  22*  CREATININE 1.87*   < > 1.76*  CALCIUM 8.1*  --  8.2*  MG  --    < > 2.1  AST 15  --   --   ALT 10  --   --   ALKPHOS 49  --   --   BILITOT 1.1  --   --    < > = values in this interval not displayed.   MEDICATIONS:  Scheduled Meds: . apixaban  2.5 mg Oral BID  . cholecalciferol  5,000 Units Oral Daily  . loratadine  10 mg Oral Daily  . midodrine  5 mg Oral TID WC  . pyridOXINE  25 mg Oral Daily  . valACYclovir  500 mg Oral Daily  . vitamin B-12  1,000 mcg Oral Daily   Continuous Infusions: . sodium chloride 75 mL/hr at 08/28/21 1103   RADIOLOGY:  MR ANGIO HEAD WO CONTRAST  Result Date: 08/27/2021 CLINICAL DATA:  Seizure EXAM: MRI  HEAD WITH  CONTRAST MRA HEAD WITHOUT CONTRAST TECHNIQUE: Multiplanar, multi-echo pulse sequences of the brain and surrounding structures were acquired with intravenous contrast. Angiographic images of the Circle of Willis were acquired using MRA technique without intravenous contrast. COMPARISON:  Brain MRI without contrast 08/27/2021 CONTRAST:  7 mL Gadavist FINDINGS: MRI HEAD FINDINGS There is no abnormal contrast enhancement. Temporal lobes and hippocampi are symmetric and of normal signal. MRA HEAD FINDINGS POSTERIOR CIRCULATION: --Vertebral arteries: Normal --Inferior cerebellar arteries: Normal. --Basilar artery: Normal. --Superior  cerebellar arteries: Normal. --Posterior cerebral arteries: Normal. ANTERIOR CIRCULATION: --Intracranial internal carotid arteries: Normal. --Anterior cerebral arteries (ACA): Normal. --Middle cerebral arteries (MCA): Normal. ANATOMIC VARIANTS: None IMPRESSION: Normal intracranial MRA. Electronically Signed   By: Deatra Robinson M.D.   On: 08/27/2021 21:35   MR BRAIN WO CONTRAST  Result Date: 08/27/2021 CLINICAL DATA:  Dizziness, non-specific EXAM: MRI HEAD WITHOUT CONTRAST TECHNIQUE: Multiplanar, multiecho pulse sequences of the brain and surrounding structures were obtained without intravenous contrast. COMPARISON:  CT head 08/26/2021. FINDINGS: Brain: No acute infarction, hemorrhage, hydrocephalus, extra-axial collection or mass lesion. Mild to moderate T2 hyperintensities within the white matter, which are nonspecific but most likely related to chronic microvascular ischemic disease given the patient's risk factors. Ossification along the falx. Vascular: Major arterial flow voids are maintained at the skull base. Skull and upper cervical spine: Normal marrow signal. Sinuses/Orbits: Negative. Other: No sizable mastoid effusions. IMPRESSION: 1. No evidence of acute intracranial abnormality. 2. 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 Electronically Signed   By: Feliberto Harts M.D.   On: 08/27/2021 13:47   MR BRAIN W CONTRAST  Result Date: 08/27/2021 CLINICAL DATA:  Seizure EXAM: MRI HEAD WITH  CONTRAST MRA HEAD WITHOUT CONTRAST TECHNIQUE: Multiplanar, multi-echo pulse sequences of the brain and surrounding structures were acquired with intravenous contrast. Angiographic images of the Circle of Willis were acquired using MRA technique without intravenous contrast. COMPARISON:  Brain MRI without contrast 08/27/2021 CONTRAST:  7 mL Gadavist FINDINGS: MRI HEAD FINDINGS There is no abnormal contrast enhancement. Temporal lobes and hippocampi are symmetric and of normal signal. MRA HEAD FINDINGS  POSTERIOR CIRCULATION: --Vertebral arteries: Normal --Inferior cerebellar arteries: Normal. --Basilar artery: Normal. --Superior cerebellar arteries: Normal. --Posterior cerebral arteries: Normal. ANTERIOR CIRCULATION: --Intracranial internal carotid arteries: Normal. --Anterior cerebral arteries (ACA): Normal. --Middle cerebral arteries (MCA): Normal. ANATOMIC VARIANTS: None IMPRESSION: Normal intracranial MRA. Electronically Signed   By: Deatra Robinson M.D.   On: 08/27/2021 21:35   EEG adult  Result Date: 08/28/2021 Rejeana Brock, MD     08/28/2021  1:05 PM History: 60 year old female being evaluated for partial seizure Sedation: None Technique: This EEG was acquired with electrodes placed according to the International 10-20 electrode system (including Fp1, Fp2, F3, F4, C3, C4, P3, P4, O1, O2, T3, T4, T5, T6, A1, A2, Fz, Cz, Pz). The following electrodes were missing or displaced: none. Background: The background consists of intermixed alpha and beta activities. There is a well defined posterior dominant rhythm of 8 Hz that attenuates with eye opening.  With drowsiness there is anterior shifting of the posterior dominant rhythm, but  sleep is not recorded. Photic stimulation: Physiologic driving is present EEG Abnormalities: None Clinical Interpretation: This normal EEG is recorded in the waking and drowsy state. There was no seizure or seizure predisposition recorded on this study. Please note that lack of epileptiform activity on EEG does not preclude the possibility of epilepsy. Ritta Slot, MD [facility] [phone] If 7pm- 7am, please page neurology on  call as listed in AMION.   ASSESSMENT AND PLAN:  60 year old female with a known history of disseminated amyloidosis including cardiac, kidney, history of DVT on Eliquis, rheumatoid arthritis, chronically elevated troponins, anemia of chronic disease, CKD stage IIIa, history of orthostasis with recurrent presyncope/syncope  followed by [facility] cardiology, Bell's palsy is admitted for presyncope/syncope  8/29: Cardiology and neurology consultation.  MRI of the brain.  Checking orthostatics, PT, OT, dietitian eval 8/30: Patient still very symptomatic and orthostatic.  Midodrine started.  Negative neuro work-up.  Principal Problem:   Postural dizziness with presyncope Active Problems:   AL amyloidosis (HCC)   Anemia in chronic kidney disease   Cardiac amyloidosis (HCC)   Chronic deep vein thrombosis (DVT) of proximal vein of left lower extremity (HCC)   Hypertension   Patient is Jehovah's Witness   Rheumatoid arthritis (HCC)   Troponin level elevated   Pancytopenia (HCC)   Hypomagnesemia   AKI (acute kidney injury) (HCC)   Demand ischemia (HCC)  Presyncope/syncope Likely due to orthostatic hypotension with possible autonomic dysfunction/malnutrition -Appreciate cardiology and neurology input.  Normal EEG, MRI and MRA brain within normal limit - she is still very symptomatic (dizzy) and dropped BP in 60s working with mobility specialist this am. Will start midodrine today -PT recommends SNF.  TOC aware and working on it  Elevated troponin She does have chronically elevated troponin looking back at her [facility] cardiology note in Care Everywhere - due to demand ischemia.  No MI.  She is not reporting any chest pain or dyspnea. -Echo showing moderate LVH with normal LV systolic function.  Outpatient follow-up at [facility] cardiology  Disseminated amyloidosis With involvement of stomach, heart, fascial muscles getting weekly infusion/every Monday at [facility] for treatment  CKD stage IIIa from amyloidosis Kidney function close to baseline  History of DVT On Eliquis  Anemia of chronic kidney disease Hemoglobin 7.4-> 8.4, no active signs of bleeding  Pancytopenia Likely due to underlying treatment for amyloidosis Transfuse blood if hemoglobin drops less than 7  Moderate Malnutrition with > 56 lbs weight loss in last  7 months due to poor PO intake since chemo started for amyloidosis - dietitian c/s Body mass index is 34 kg/m.  Net IO Since Admission: 1,980.68 mL [08/28/21 1318]      LOS: 2 days   Consultants: Neurology Cardiology   Status is: Inpatient  Remains inpatient appropriate because:Ongoing diagnostic testing needed not appropriate for outpatient work up  Dispo: The patient is from: Home              Anticipated d/c is to: Home with home health versus SNF depending on ongoing work with PT              Patient currently is not medically stable to d/c.   Difficult to place patient No    DVT prophylaxis:       apixaban (ELIQUIS) tablet 2.5 mg Start: 08/27/21 1500 apixaban (ELIQUIS) tablet 2.5 mg     Family Communication: Updated son and daughter at bedside on 8/29   All the records are reviewed and case discussed with Nursing and [facility] team. Management plans discussed with the patient, Nursing and they are in agreement.  CODE STATUS: Full Code Level of care: Med-Surg  TOTAL TIME TAKING CARE OF THIS PATIENT: 35 minutes.   More than 50% of the time was spent in counseling/coordination of care: YES  POSSIBLE D/C IN 1-2 DAYS, DEPENDING ON CLINICAL CONDITION.   Delfino Lovett M.D on 08/28/2021  at 1:18 PM  [facility]   CC: Primary care physician; Reather Converse, MD  Note: This dictation was prepared with Dragon dictation along with smaller phrase technology. Any transcriptional errors that result from this process are unintentional.

## 2021-08-28 NOTE — Progress Notes (Signed)
Eeg done 

## 2021-08-28 NOTE — Progress Notes (Signed)
Initial Nutrition Assessment  DOCUMENTATION CODES:   Non-severe (moderate) malnutrition in context of chronic illness  INTERVENTION:   Ensure Enlive po TID, each supplement provides 350 kcal and 20 grams of protein  MVI po daily   Liberalize diet  Pt at high refeed risk; recommend monitor potassium, magnesium and phosphorus labs daily until stable  NUTRITION DIAGNOSIS:   Moderate Malnutrition related to chronic illness as evidenced by 25 percent weight loss in 7 months, moderate fat depletion, mild muscle depletion.  GOAL:   Patient will meet greater than or equal to 90% of their needs  MONITOR:   PO intake, Supplement acceptance, Labs, Weight trends, I & O's, Skin  REASON FOR ASSESSMENT:   Consult Assessment of nutrition requirement/status  ASSESSMENT:   60 year old female with a known history of disseminated amyloidosis, DVT on Eliquis, rheumatoid arthritis, chronically elevated troponins, anemia of chronic disease, CKD stage IIIa, orthostasis with recurrent presyncope/syncope followed by [facility] cardiology and Bell's palsy is admitted for presyncope/syncope  Met with pt in room today. Pt reports poor appetite and oral intake for the past year. Pt reports that her appetite has been poor since starting chemotherapy treatments and that she has lost a significant amount of weight. Per Chart, pt is down 56lbs(25%) over the past 7 months; this is severe weight loss. Pt reports that she was drinking chocolate [employer] at home but that she quit drinking this about 3 weeks ago when there was a recall on some of their products. Pt did not try another supplement. RD discussed with pt the importance of adequate nutrition needed to preserve lean muscle. Pt is willing to drink chocolate Ensure in hospital. RD will add supplements and MVI to help pt meet her estimated needs. RD will also liberalize pt's diet. Pt is likely at high refeed risk. Pt documented to have eaten sips and bites of  her breakfast. Pt did eat a grilled cheese for lunch today but reports that she did not eat her soup as it was cold.   Medications reviewed and include: D3, B-6, B12, NaCl $RemoveB'@75ml'ElsGsvah$ /hr  Labs reviewed: K 3.1(L), BUN 22(H), creat 1.76(H), Mg 2.1 wnl Wbc- 2.0(L), Hgb 8.4(L), Hct 24.0(L)  NUTRITION - FOCUSED PHYSICAL EXAM:  Flowsheet Row Most Recent Value  Orbital Region No depletion  Upper Arm Region Moderate depletion  Thoracic and Lumbar Region No depletion  Buccal Region No depletion  Temple Region No depletion  Clavicle Bone Region Mild depletion  Clavicle and Acromion Bone Region Mild depletion  Scapular Bone Region No depletion  Dorsal Hand Mild depletion  Patellar Region Mild depletion  Anterior Thigh Region No depletion  Posterior Calf Region No depletion  Edema (RD Assessment) None  Hair Reviewed  Eyes Reviewed  Mouth Reviewed  Skin Reviewed  Nails Reviewed   Diet Order:   Diet Order             Diet regular Room service appropriate? Yes; Fluid consistency: Thin  Diet effective now                  EDUCATION NEEDS:   Education needs have been addressed  Skin:  Skin Assessment: Reviewed RN Assessment  Last BM:  8/29  Height:   Ht Readings from Last 1 Encounters:  08/26/21 5' (1.524 m)    Weight:   Wt Readings from Last 1 Encounters:  08/27/21 79 kg    Ideal Body Weight:  45.45 kg  BMI:  Body mass index is 34 kg/m.  Estimated  Nutritional Needs:   Kcal:  1700-2000kcal/day  Protein:  85-100g/day  Fluid:  1.4-1.6L/day  Koleen Distance MS, RD, LDN Please refer to [facility] for RD and/or RD on-call/weekend/after hours pager

## 2021-08-28 NOTE — Plan of Care (Signed)
  Problem: Education: Goal: Knowledge of General Education information will improve Description: Including pain rating scale, medication(s)/side effects and non-pharmacologic comfort measures 08/28/2021 1139 by Ansel Bong, RN Outcome: Progressing 08/28/2021 1139 by Ansel Bong, RN Outcome: Progressing   Problem: Health Behavior/Discharge Planning: Goal: Ability to manage health-related needs will improve 08/28/2021 1139 by Ansel Bong, RN Outcome: Progressing 08/28/2021 1139 by Ansel Bong, RN Outcome: Progressing   Problem: Clinical Measurements: Goal: Ability to maintain clinical measurements within normal limits will improve 08/28/2021 1139 by Ansel Bong, RN Outcome: Progressing 08/28/2021 1139 by Ansel Bong, RN Outcome: Progressing Goal: Will remain free from infection 08/28/2021 1139 by Ansel Bong, RN Outcome: Progressing 08/28/2021 1139 by Ansel Bong, RN Outcome: Progressing Goal: Diagnostic test results will improve 08/28/2021 1139 by Ansel Bong, RN Outcome: Progressing 08/28/2021 1139 by Ansel Bong, RN Outcome: Progressing Goal: Respiratory complications will improve 08/28/2021 1139 by Ansel Bong, RN Outcome: Progressing 08/28/2021 1139 by Ansel Bong, RN Outcome: Progressing Goal: Cardiovascular complication will be avoided 08/28/2021 1139 by Ansel Bong, RN Outcome: Progressing 08/28/2021 1139 by Ansel Bong, RN Outcome: Progressing   Problem: Activity: Goal: Risk for activity intolerance will decrease 08/28/2021 1139 by Ansel Bong, RN Outcome: Progressing 08/28/2021 1139 by Ansel Bong, RN Outcome: Progressing   Problem: Nutrition: Goal: Adequate nutrition will be maintained 08/28/2021 1139 by Ansel Bong, RN Outcome: Progressing 08/28/2021 1139 by Ansel Bong, RN Outcome: Progressing   Problem: Coping: Goal: Level of anxiety will decrease 08/28/2021 1139 by Ansel Bong, RN Outcome:  Progressing 08/28/2021 1139 by Ansel Bong, RN Outcome: Progressing   Problem: Elimination: Goal: Will not experience complications related to bowel motility 08/28/2021 1139 by Ansel Bong, RN Outcome: Progressing 08/28/2021 1139 by Ansel Bong, RN Outcome: Progressing Goal: Will not experience complications related to urinary retention 08/28/2021 1139 by Ansel Bong, RN Outcome: Progressing 08/28/2021 1139 by Ansel Bong, RN Outcome: Progressing   Problem: Pain Managment: Goal: General experience of comfort will improve 08/28/2021 1139 by Ansel Bong, RN Outcome: Progressing 08/28/2021 1139 by Ansel Bong, RN Outcome: Progressing   Problem: Safety: Goal: Ability to remain free from injury will improve 08/28/2021 1139 by Ansel Bong, RN Outcome: Progressing 08/28/2021 1139 by Ansel Bong, RN Outcome: Progressing   Problem: Skin Integrity: Goal: Risk for impaired skin integrity will decrease 08/28/2021 1139 by Ansel Bong, RN Outcome: Progressing 08/28/2021 1139 by Ansel Bong, RN Outcome: Progressing

## 2021-08-28 NOTE — Progress Notes (Signed)
Mobility Specialist - Progress Note   08/28/21 0900  Mobility  Activity Transferred:  Bed to chair  Level of Assistance Standby assist, set-up cues, supervision of patient - no hands on  Assistive Device None  Distance Ambulated (ft) 4 ft  Mobility Ambulated with assistance in room  Mobility Response Tolerated well  Mobility performed by Mobility specialist  $Mobility charge 1 Mobility    Pre-mobility: 79 HR, 100% SpO2, 104/67 BP (supine) During mobility: 80/58 BP (seated-recliner) Post-mobility: 93/61 BP (recliner with LE elevated)   Pt semi-supine in bed, BP checked prior to activity. Pt sat EOB participated in seated grooming tasks. Dizziness only with standing. Reports ears ringing. Pt transferred from B-C with supervision. Dizziness coming in waves once seated in recliner with pt now reporting nausea, RN notified and entered to address concern. BP as low as 61/41 while seated in chair, did improve to 93/61 with LE elevated. Alarm set, needs in reach.    Filiberto Pinks Mobility Specialist 08/28/21, 10:17 AM

## 2021-08-28 NOTE — Progress Notes (Signed)
Subjective: No further seizures.   Exam: Vitals:   08/28/21 1000 08/28/21 1010  BP: (!) 61/41 93/61  Pulse:    Resp:    Temp:    SpO2:     Gen: In bed, NAD Resp: non-labored breathing, no acute distress Abd: soft, nt  Neuro: MS: awake, alert, oriented CN: right eye full eom. Left eye with increasing exotropia with both up and downgaze. Left sided peripheral facial weakness.  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  Cranial nerve involvement with amyloidosis is fairly rare, but has been reported.  I am not sure even if we were to prove cranial nerve involvement in this disease if we would modify our therapy given that she is already  on a chemotherapeutic regimen.  Rheumatoid arthritis can cause an aseptic meningitis with meningeal thickening which does not necessarily need to be associated with headache, but can cause focal seizures as well as cranial neuropathies.  With no evidence of enhancement on MRI, I think that LP would be relatively low yield.   Microvascular 3rd nerve palsy as well as idiopathic Bell's palsy are also possible, but given the presence of multiple cranial nerve deficits in the patient with complicated hematological and rheumatological disease, I think involvement of these diseases needs to be considered.    Seizure and cranial nerve palsy have been reported with bortezomib. Seizure has been reported with cyclophosphamide, but I think this is most often in conjunction with PRES, which is not demonstrated on MRI.   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  I would have her follow-up with her outpatient rheumatologist,  hematologist, and her neurologist.  Given her low blood pressures, it is certainly possible that this was a provoked seizure, as well as her hypomagnesemia, and therefore I do not favor starting antiepileptic therapy for this single event.  Recommendations: 1) EEG was negative, I would not start antiepileptic therapy at this time. 2) appreciate cardiology assistance with blood pressure support 3) no further testing at this time, please call with further questions or concerns.   Ritta Slot, MD [facility] [phone]  If 7pm- 7am, please page neurology on call as listed in [facility].

## 2021-08-28 NOTE — Procedures (Signed)
History: 60 year old female being evaluated for partial seizure  Sedation: None  Technique: This EEG was acquired with electrodes placed according to the International 10-20 electrode system (including Fp1, Fp2, F3, F4, C3, C4, P3, P4, O1, O2, T3, T4, T5, T6, A1, A2, Fz, Cz, Pz). The following electrodes were missing or displaced: none.   Background: The background consists of intermixed alpha and beta activities. There is a well defined posterior dominant rhythm of 8 Hz that attenuates with eye opening.  With drowsiness there is anterior shifting of the posterior dominant rhythm, but  sleep is not recorded.  Photic stimulation: Physiologic driving is present  EEG Abnormalities: None  Clinical Interpretation: This normal EEG is recorded in the waking and drowsy state. There was no seizure or seizure predisposition recorded on this study. Please note that lack of epileptiform activity on EEG does not preclude the possibility of epilepsy.   Ritta Slot, MD [facility] [phone]  If 7pm- 7am, please page neurology on call as listed in [facility].

## 2021-08-29 ENCOUNTER — Encounter: Payer: Self-pay | Admitting: Internal Medicine

## 2021-08-29 DIAGNOSIS — R42 Dizziness and giddiness: Secondary | ICD-10-CM | POA: Diagnosis not present

## 2021-08-29 DIAGNOSIS — R55 Syncope and collapse: Secondary | ICD-10-CM | POA: Diagnosis not present

## 2021-08-29 LAB — CBC
HCT: 22.4 % — ABNORMAL LOW (ref 36.0–46.0)
Hemoglobin: 7.7 g/dL — ABNORMAL LOW (ref 12.0–15.0)
MCH: 33.5 pg (ref 26.0–34.0)
MCHC: 34.4 g/dL (ref 30.0–36.0)
MCV: 97.4 fL (ref 80.0–100.0)
Platelets: 71 10*3/uL — ABNORMAL LOW (ref 150–400)
RBC: 2.3 MIL/uL — ABNORMAL LOW (ref 3.87–5.11)
RDW: 16.8 % — ABNORMAL HIGH (ref 11.5–15.5)
WBC: 1.9 10*3/uL — ABNORMAL LOW (ref 4.0–10.5)
nRBC: 0 % (ref 0.0–0.2)

## 2021-08-29 LAB — BASIC METABOLIC PANEL
Anion gap: 7 (ref 5–15)
BUN: 21 mg/dL — ABNORMAL HIGH (ref 6–20)
CO2: 22 mmol/L (ref 22–32)
Calcium: 8.1 mg/dL — ABNORMAL LOW (ref 8.9–10.3)
Chloride: 106 mmol/L (ref 98–111)
Creatinine, Ser: 1.92 mg/dL — ABNORMAL HIGH (ref 0.44–1.00)
GFR, Estimated: 30 mL/min — ABNORMAL LOW (ref >60)
Glucose, Bld: 79 mg/dL (ref 70–99)
Potassium: 3.5 mmol/L (ref 3.5–5.1)
Sodium: 135 mmol/L (ref 135–145)

## 2021-08-29 MED ORDER — POTASSIUM CHLORIDE CRYS ER 20 MEQ PO TBCR
40.0000 meq | EXTENDED_RELEASE_TABLET | Freq: Once | ORAL | Status: AC
  Administered 2021-08-29: 40 meq via ORAL
  Filled 2021-08-29: qty 2

## 2021-08-29 NOTE — Progress Notes (Signed)
Physical Therapy Treatment Patient Details Name: Tammy Hardin MRN: [id number] DOB: [birth year] Today's Date: 08/29/2021    History of Present Illness Tammy Hardin is 60 year old female with a known history of disseminated amyloidosis including cardiac and kidney; history of DVT on Eliquis; rheumatoid arthritis; chronically elevated troponins; anemia of chronic disease; CKD stage IIIa, Bell's palsy. Brought to ED for presyncope/syncope and chest pain.    PT Comments    Pt continuing to make progress towards goals. Pt demonstrating increased dizziness with static standing after 1 minute. BP monitored during static and dynamic standing activities (see below) and pt reporting no dizziness during dynamic activities. Pt able to ambulate >200 ft with close SBA and RW and reported no dizziness. Recommending home with 24/7 family assistance and supervision for patient safety as well as trial TED hose to improve blood pressure. Pt in agreeance with PT recommendations and reports family will be available to assist her.  Blood Pressure Readings Sitting EOB: 92/69 mmHg Static Standing (1 min): 72/50 mmHg Standing with marching: 100/72 mmHg After ambulation: 98/48 mmHg   Follow Up Recommendations  Home health PT;Supervision/Assistance - 24 hour     Equipment Recommendations  None recommended by PT    Recommendations for Other Services       Precautions / Restrictions Precautions Precautions: Fall Restrictions Weight Bearing Restrictions: No    Mobility  Bed Mobility Overal bed mobility: Modified Independent Bed Mobility: Supine to Sit;Sit to Supine     Supine to sit: Modified independent (Device/Increase time) Sit to supine: Modified independent (Device/Increase time)   General bed mobility comments: Increased time and pt observed to require assist from UEs to assist LEs into bed    Transfers Overall transfer level: Needs assistance Equipment used: Rolling walker (2  wheeled) Transfers: Sit to/from Stand Sit to Stand: Supervision         General transfer comment: Pt reports no dizziness with inital standing; Pt symtpomatic after 1 minute of static standing requesting to sit down  Ambulation/Gait Ambulation/Gait assistance: Min guard;+2 physical assistance Gait Distance (Feet): 220 Feet Assistive device: Rolling walker (2 wheeled) Gait Pattern/deviations: Step-through pattern Gait velocity: Slower than normal   General Gait Details: +2 for chair follow for safety; BP 98/48 during/immediately after ambulation. Pt demonstrating good awareness of safety with assistive device and her activity tolerance.   Stairs             Wheelchair Mobility    Modified Rankin (Stroke Patients Only)       Balance Overall balance assessment: History of Falls;Needs assistance Sitting-balance support: No upper extremity supported;Feet supported Sitting balance-Leahy Scale: Good     Standing balance support: Bilateral upper extremity supported Standing balance-Leahy Scale: Good Standing balance comment: Pt dizzy after 1 min of static standing and not symptomatic after ambulation             Cognition Arousal/Alertness: Awake/alert Behavior During Therapy: WFL for tasks assessed/performed Overall Cognitive Status: Within Functional Limits for tasks assessed            General Comments: pleasant and agreeable to therapy      Exercises Other Exercises Other Exercises: Standing marching 3 x 30 seconds and ankle pumps 1 x 20 with B UE support on RW    General Comments        Pertinent Vitals/Pain Pain Assessment: No/denies pain    Home Living  Prior Function            PT Goals (current goals can now be found in the care plan section) Acute Rehab PT Goals Patient Stated Goal: to have more energy PT Goal Formulation: With patient Time For Goal Achievement: 09/10/21 Potential to Achieve Goals:  Good Progress towards PT goals: Progressing toward goals    Frequency    Min 2X/week      PT Plan Discharge plan needs to be updated    Co-evaluation              AM-PAC PT "6 Clicks" Mobility   Outcome Measure  Help needed turning from your back to your side while in a flat bed without using bedrails?: None Help needed moving from lying on your back to sitting on the side of a flat bed without using bedrails?: None Help needed moving to and from a bed to a chair (including a wheelchair)?: None Help needed standing up from a chair using your arms (e.g., wheelchair or bedside chair)?: None Help needed to walk in hospital room?: None Help needed climbing 3-5 steps with a railing? : A Little 6 Click Score: 23    End of Session Equipment Utilized During Treatment: Gait belt Activity Tolerance: Patient tolerated treatment well Patient left: in bed;with call bell/phone within reach Nurse Communication: Mobility status PT Visit Diagnosis: Unsteadiness on feet (R26.81);Muscle weakness (generalized) (M62.81);History of falling (Z91.81);Difficulty in walking, not elsewhere classified (R26.2);Dizziness and giddiness (R42)     Time: 1330-1401 PT Time Calculation (min) (ACUTE ONLY): 31 min  Charges:  $Gait Training: 8-22 mins $Therapeutic Exercise: 8-22 mins                     Verl Blalock, SPT    Verl Blalock 08/29/2021, 4:20 PM

## 2021-08-29 NOTE — Care Management Important Message (Signed)
Important Message  Patient Details  Name: Tammy Hardin MRN: [id number] Date of Birth: 1961-02-04   Medicare Important Message Given:  Yes     Johnell Comings 08/29/2021, 11:02 AM

## 2021-08-29 NOTE — Progress Notes (Addendum)
Progress Note    Tammy Hardin  YSH:[id number] DOB: 1961-08-04  DOA: 08/26/2021 PCP: Reather Converse, MD      Brief Narrative:    Medical records reviewed and are as summarized below:  Tammy Hardin is a 60 y.o. 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  (Obesity)   Presyncope/syncope likely from orthostatic hypotension she still orthostatic.  She was started 08/28/2021.  Continue midodrine for response.  Elevated troponin: Likely from demand ischemia.  2D echo showed LVH and normal LV function.  Disseminated amyloidosis (heart, stomach, kidneys): She gets infusion every Monday at [facility]  History of DVT: On Eliquis.  No evidence of AKI.  Baseline creatinine is around 2.  Discontinue IV fluids.  Hypokalemia and hypomagnesemia: Improved  Other comorbidities include CKD stage IIIb, anemia of chronic disease, pancytopenia  Possible discharge to home versus SNF tomorrow.  Diet Order              Diet regular Room service appropriate? Yes; Fluid consistency: Thin  Diet effective now                      Consultants: Neurologist Cardiologist  Procedures: None    Medications:    apixaban  2.5 mg Oral BID   cholecalciferol  5,000 Units Oral Daily   feeding supplement  237 mL Oral TID BM   loratadine  10 mg Oral Daily   midodrine  5 mg Oral TID WC   multivitamin with minerals  1 tablet Oral Daily   potassium chloride  40 mEq Oral Once   pyridOXINE  25 mg Oral Daily   valACYclovir  500 mg Oral Daily   vitamin B-12  1,000 mcg Oral Daily   Continuous Infusions:     Anti-infectives (From admission, onward)    Start     Dose/Rate Route Frequency Ordered Stop   08/27/21 1000  valACYclovir (VALTREX) tablet 500 mg        500 mg Oral Daily 08/26/21 2351                Family Communication/Anticipated D/C date and plan/Code Status   DVT prophylaxis: apixaban (ELIQUIS) tablet 2.5 mg Start: 08/27/21 1500 apixaban (ELIQUIS) tablet 2.5 mg     Code Status: Full Code  Family Communication: None Disposition Plan:    Status is: Inpatient  Remains inpatient appropriate because:Unsafe d/c plan and Inpatient level of care appropriate due to severity of illness  Dispo: The patient is from: Home  Anticipated d/c is to: SNF              Patient currently is not medically stable to d/c.   Difficult to place patient No           Subjective:   Interval events noted.  Dizziness is better today.  No chest pain.  Objective:    Vitals:   08/28/21 1933 08/28/21 2314 08/29/21 0324 08/29/21 1234  BP: 116/67 (!) 103/55 106/68 113/69  Pulse: 75 87 93 83  Resp: Temp: 97.8 F (36.6 C) 98.6 F (37 C) 98.1 F (36.7 C) 98.9 F (37.2 C)  TempSrc:    Oral  SpO2: 100% 99% 100% 100%  Weight:   80.8 kg   Height:       No data found.  08/29/2021: Sitting BP 92/69, standing BP 72/50    Intake/Output Summary  (Last 24 hours) at 08/29/2021 1725 Last data filed at 08/29/2021 1034 Gross per 24 hour  Intake 2129.07 ml  Output 1 ml  Net 2128.07 ml   Filed Weights   08/26/21 1930 08/27/21 1720 08/29/21 0324  Weight: 80.7 kg 79 kg 80.8 kg    Exam:  GEN: NAD SKIN: Warm and dry EYES: No pallor or icterus ENT: MMM CV: RRR PULM: CTA B ABD: soft, ND, NT, +BS CNS: AAO x 3, non focal EXT: No edema or tenderness        Data Reviewed:   I have personally reviewed following labs and imaging studies:  Labs: Labs show the following:   Basic Metabolic Panel: Recent Labs  Lab 08/26/21 1936 08/26/21 2130 08/27/21 1257 08/28/21 0532 08/29/21 0518  NA 138  --   --  136 135  K 3.3*  --   --  3.1* 3.5  CL 107  --   --  103 106  CO2 19*  --   --  22 22  GLUCOSE 100*  --   --  74 79  BUN 22*  --   --  22* 21*  CREATININE 1.87*  --  1.35* 1.76* 1.92*  CALCIUM 8.1*  --   --  8.2* 8.1*  MG  --  1.6* 1.3* 2.1  --    GFR Estimated Creatinine Clearance: 29.7 mL/min (A) (by C-G formula based on SCr of 1.92 mg/dL (H)). Liver Function Tests: Recent Labs  Lab 08/26/21 1936  AST 15  ALT 10  ALKPHOS 49  BILITOT 1.1  PROT 4.3*  ALBUMIN 2.5*   No results for input(s): LIPASE, AMYLASE in the last 168 hours. No results for input(s): AMMONIA in the last 168 hours. Coagulation profile Recent Labs  Lab 08/26/21 2130  INR 1.0    CBC: Recent Labs  Lab 08/26/21 1936 08/27/21 1257 08/28/21 0532 08/29/21 0518  WBC 3.0* 2.3* 2.0* 1.9*  HGB 9.2* 7.4* 8.4* 7.7*  HCT 27.9* 21.8* 24.0* 22.4*  MCV 97.2 98.2 96.4 97.4  PLT 67* 55* 65* 71*   Cardiac Enzymes: No results for input(s): CKTOTAL, CKMB, CKMBINDEX, TROPONINI in the last 168 hours. BNP (last 3 results) No results for input(s): PROBNP in the last 8760 hours. CBG: Recent Labs  Lab 08/28/21 1646  GLUCAP 81   D-Dimer: No results for input(s): DDIMER in the last 72 hours. Hgb A1c: No results for input(s): HGBA1C in the last 72  hours. Lipid Profile: Recent Labs    08/27/21 0337  CHOL 195  HDL 26*  LDLCALC 147*  TRIG 108  CHOLHDL  7.5   Thyroid function studies: No results for input(s): TSH, T4TOTAL, T3FREE, THYROIDAB in the last 72 hours.  Invalid input(s): FREET3 Anemia work up: No results for input(s): VITAMINB12, FOLATE, FERRITIN, TIBC, IRON, RETICCTPCT in the last 72 hours. Sepsis Labs: Recent Labs  Lab 08/26/21 1936 08/27/21 1257 08/28/21 0532 08/29/21 0518  WBC 3.0* 2.3* 2.0* 1.9*    Microbiology Recent Results (from the past 240 hour(s))  Resp Panel by RT-PCR (Flu A&B, Covid) Nasopharyngeal Swab     Status: None   Collection Time: 08/26/21  9:30 PM   Specimen: Nasopharyngeal Swab; Nasopharyngeal(NP) swabs in vial transport medium  Result Value Ref Range Status   SARS Coronavirus 2 by RT PCR NEGATIVE NEGATIVE Final    Comment: (NOTE) SARS-CoV-2 target nucleic acids are NOT DETECTED.  The SARS-CoV-2 RNA is generally detectable in upper respiratory specimens during the acute phase of infection. The lowest concentration of SARS-CoV-2 viral copies this assay can detect is 138 copies/mL. A negative result does not preclude SARS-Cov-2 infection and should not be used as the sole basis for treatment or other patient management decisions. A negative result may occur with  improper specimen collection/handling, submission of specimen other than nasopharyngeal swab, presence of viral mutation(s) within the areas targeted by this assay, and inadequate number of viral copies(<138 copies/mL). A negative result must be combined with clinical observations, patient history, and epidemiological information. The expected result is Negative.  Fact Sheet for Patients:  BloggerCourse.com  Fact Sheet for Healthcare Providers:  SeriousBroker.it  This test is no t yet approved or cleared by the [place] FDA and  has been authorized for detection  and/or diagnosis of SARS-CoV-2 by FDA under an Emergency Use Authorization (EUA). This EUA will remain  in effect (meaning this test can be used) for the duration of the COVID-19 declaration under Section 564(b)(1) of the Act, 21 U.S.C.section 360bbb-3(b)(1), unless the authorization is terminated  or revoked sooner.       Influenza A by PCR NEGATIVE NEGATIVE Final   Influenza B by PCR NEGATIVE NEGATIVE Final    Comment: (NOTE) The Xpert Xpress SARS-CoV-2/FLU/RSV plus assay is intended as an aid in the diagnosis of influenza from Nasopharyngeal swab specimens and should not be used as a sole basis for treatment. Nasal washings and aspirates are unacceptable for Xpert Xpress SARS-CoV-2/FLU/RSV testing.  Fact Sheet for Patients: BloggerCourse.com  Fact Sheet for Healthcare Providers: SeriousBroker.it  This test is not yet approved or cleared by the [place] FDA and has been authorized for detection and/or diagnosis of SARS-CoV-2 by FDA under an Emergency Use Authorization (EUA). This EUA will remain in effect (meaning this test can be used) for the duration of the COVID-19 declaration under Section 564(b)(1) of the Act, 21 U.S.C. section 360bbb-3(b)(1), unless the authorization is terminated or revoked.  Performed at [facility], [address] [place], [place] [address]     Procedures and diagnostic studies:  MR ANGIO HEAD WO CONTRAST  Result Date: 08/27/2021 CLINICAL DATA:  Seizure EXAM: MRI HEAD WITH  CONTRAST MRA HEAD WITHOUT CONTRAST TECHNIQUE: Multiplanar, multi-echo pulse sequences of the brain and surrounding structures were acquired with intravenous contrast. Angiographic images of the Circle of Willis were acquired using MRA technique without intravenous contrast. COMPARISON:  Brain MRI without contrast 08/27/2021 CONTRAST:  7 mL Gadavist FINDINGS: MRI HEAD FINDINGS There is no abnormal contrast  enhancement. Temporal lobes and hippocampi are symmetric and of normal signal. MRA HEAD FINDINGS POSTERIOR CIRCULATION: --Vertebral arteries: Normal --  Inferior cerebellar arteries: Normal. --Basilar artery: Normal. --Superior cerebellar arteries: Normal. --Posterior cerebral arteries: Normal. ANTERIOR CIRCULATION: --Intracranial internal carotid arteries: Normal. --Anterior cerebral arteries (ACA): Normal. --Middle cerebral arteries (MCA): Normal. ANATOMIC VARIANTS: None IMPRESSION: Normal intracranial MRA. Electronically Signed   By: Deatra Robinson M.D.   On: 08/27/2021 21:35   MR BRAIN W CONTRAST  Result Date: 08/27/2021 CLINICAL DATA:  Seizure EXAM: MRI HEAD WITH  CONTRAST MRA HEAD WITHOUT CONTRAST TECHNIQUE: Multiplanar, multi-echo pulse sequences of the brain and surrounding structures were acquired with intravenous contrast. Angiographic images of the Circle of Willis were acquired using MRA technique without intravenous contrast. COMPARISON:  Brain MRI without contrast 08/27/2021 CONTRAST:  7 mL Gadavist FINDINGS: MRI HEAD FINDINGS There is no abnormal contrast enhancement. Temporal lobes and hippocampi are symmetric and of normal signal. MRA HEAD FINDINGS POSTERIOR CIRCULATION: --Vertebral arteries: Normal --Inferior cerebellar arteries: Normal. --Basilar artery: Normal. --Superior cerebellar arteries: Normal. --Posterior cerebral arteries: Normal. ANTERIOR CIRCULATION: --Intracranial internal carotid arteries: Normal. --Anterior cerebral arteries (ACA): Normal. --Middle cerebral arteries (MCA): Normal. ANATOMIC VARIANTS: None IMPRESSION: Normal intracranial MRA. Electronically Signed   By: Deatra Robinson M.D.   On: 08/27/2021 21:35   EEG adult  Result Date: 08/28/2021 Rejeana Brock, MD     08/28/2021  1:05 PM History: 60 year old female being evaluated for partial seizure Sedation: None Technique: This EEG was acquired with electrodes placed according to the International 10-20 electrode  system (including Fp1, Fp2, F3, F4, C3, C4, P3, P4, O1, O2, T3, T4, T5, T6, A1, A2, Fz, Cz, Pz). The following electrodes were missing or displaced: none. Background: The background consists of intermixed alpha and beta activities. There is a well defined posterior dominant rhythm of 8 Hz that attenuates with eye opening.  With drowsiness there is anterior shifting of the posterior dominant rhythm, but  sleep is not recorded. Photic stimulation: Physiologic driving is present EEG Abnormalities: None Clinical Interpretation: This normal EEG is recorded in the waking and drowsy state. There was no seizure or seizure predisposition recorded on this study. Please note that lack of epileptiform activity on EEG does not preclude the possibility of epilepsy. Ritta Slot, MD [facility] [phone] If 7pm- 7am, please page neurology on call as listed in AMION.               LOS: 3 days   Miyuki Rzasa  [facility]   Pager on [email or web address]. If 7PM-7AM, please contact night-coverage at [email or web address]     08/29/2021, 5:25 PM

## 2021-08-29 NOTE — Progress Notes (Signed)
Occupational Therapy Treatment Patient Details Name: Tammy Hardin MRN: [id number] DOB: 1961-04-14 Today's Date: 08/29/2021    History of present illness Tammy Hardin is 60 year old female with a known history of disseminated amyloidosis including cardiac and kidney; history of DVT on Eliquis; rheumatoid arthritis; chronically elevated troponins; anemia of chronic disease; CKD stage IIIa, Bell's palsy. Brought to ED for presyncope/syncope and chest pain.   OT comments  Ms. Bollen did well today, appearing much more alert and stable in sitting and standing than she was 2 days ago, at OT evaluation. BP remained steady around 109/75 in supine, sitting, and standing. Pt was able to [facility] don socks today, after requiring Mod A for that task earlier this week. She transitioned supine<sit with Mod I, required SUPV for sit<>stand and stand-pivot transfers with RW, displaying no LOB and reporting no dizziness. Ms. Eltringham and her children had been adamant that she DC to their home, but, after extensive discussion this AM, she agreed that she would be best served by a stay in [facility], to build her strength, endurance, and balance. DC recs updated to SNF.    Follow Up Recommendations  SNF    Equipment Recommendations       Recommendations for Other Services      Precautions / Restrictions Precautions Precautions: Fall Restrictions Weight Bearing Restrictions: No       Mobility Bed Mobility Overal bed mobility: Needs Assistance Bed Mobility: Supine to Sit     Supine to sit: Modified independent (Device/Increase time)     General bed mobility comments: increased time required, but pt did not require any assistance for positioning BLE, as she did earlier this week    Transfers Overall transfer level: Needs assistance Equipment used: Rolling walker (2 wheeled) Transfers: Sit to/from Stand Sit to Stand: Supervision         General transfer comment: No dizziness, BP steady    Balance  Overall balance assessment: History of Falls;Needs assistance Sitting-balance support: Feet unsupported;Single extremity supported Sitting balance-Leahy Scale: Good     Standing balance support: Bilateral upper extremity supported Standing balance-Leahy Scale: Good                             ADL either performed or assessed with clinical judgement   ADL Overall ADL's : Needs assistance/impaired                     Lower Body Dressing: Supervision/safety Lower Body Dressing Details (indicate cue type and reason): donning socks                     Vision Patient Visual Report: No change from baseline     Perception     Praxis      Cognition Arousal/Alertness: Awake/alert Behavior During Therapy: WFL for tasks assessed/performed Overall Cognitive Status: Within Functional Limits for tasks assessed                                 General Comments: pleasant and eager to participate in session        Exercises Other Exercises Other Exercises: Bed mobility, dressing, transfers, grooming, sitting/standing balance tolerance. Discussion re: DC recs.   Shoulder Instructions       General Comments      Pertinent Vitals/ Pain       Pain Assessment: No/denies pain  Home Living  Prior Functioning/Environment              Frequency  Min 1X/week        Progress Toward Goals  OT Goals(current goals can now be found in the care plan section)  Progress towards OT goals: Progressing toward goals  Acute Rehab OT Goals Patient Stated Goal: to have more energy OT Goal Formulation: With patient Time For Goal Achievement: 09/10/21 Potential to Achieve Goals: Good  Plan Discharge plan needs to be updated    Co-evaluation                 AM-PAC OT "6 Clicks" Daily Activity     Outcome Measure   Help from another person eating meals?: None Help from another  person taking care of personal grooming?: A Little Help from another person toileting, which includes using toliet, bedpan, or urinal?: A Little Help from another person bathing (including washing, rinsing, drying)?: A Little Help from another person to put on and taking off regular upper body clothing?: A Little Help from another person to put on and taking off regular lower body clothing?: A Little 6 Click Score: 19    End of Session Equipment Utilized During Treatment: Rolling walker  OT Visit Diagnosis: Unsteadiness on feet (R26.81);Muscle weakness (generalized) (M62.81);Dizziness and giddiness (R42)   Activity Tolerance Patient tolerated treatment well   Patient Left in chair;with call bell/phone within reach;with chair alarm set   Nurse Communication          Time: 9371-6967 OT Time Calculation (min): 23 min  Charges: OT General Charges $OT Visit: 1 Visit OT Treatments $Self Care/Home Management : 23-37 mins  Latina Craver, PhD, MS, OTR/L 08/29/21, 11:17 AM

## 2021-08-30 DIAGNOSIS — D61818 Other pancytopenia: Secondary | ICD-10-CM | POA: Diagnosis not present

## 2021-08-30 DIAGNOSIS — R55 Syncope and collapse: Secondary | ICD-10-CM | POA: Diagnosis not present

## 2021-08-30 DIAGNOSIS — R42 Dizziness and giddiness: Secondary | ICD-10-CM | POA: Diagnosis not present

## 2021-08-30 LAB — CBC WITH DIFFERENTIAL/PLATELET
Abs Immature Granulocytes: 0.02 10*3/uL (ref 0.00–0.07)
Basophils Absolute: 0 10*3/uL (ref 0.0–0.1)
Basophils Relative: 1 %
Eosinophils Absolute: 0 10*3/uL (ref 0.0–0.5)
Eosinophils Relative: 2 %
HCT: 21.4 % — ABNORMAL LOW (ref 36.0–46.0)
Hemoglobin: 7.1 g/dL — ABNORMAL LOW (ref 12.0–15.0)
Immature Granulocytes: 1 %
Lymphocytes Relative: 10 %
Lymphs Abs: 0.2 10*3/uL — ABNORMAL LOW (ref 0.7–4.0)
MCH: 33 pg (ref 26.0–34.0)
MCHC: 33.2 g/dL (ref 30.0–36.0)
MCV: 99.5 fL (ref 80.0–100.0)
Monocytes Absolute: 0.2 10*3/uL (ref 0.1–1.0)
Monocytes Relative: 12 %
Neutro Abs: 1.3 10*3/uL — ABNORMAL LOW (ref 1.7–7.7)
Neutrophils Relative %: 74 %
Platelets: 83 10*3/uL — ABNORMAL LOW (ref 150–400)
RBC: 2.15 MIL/uL — ABNORMAL LOW (ref 3.87–5.11)
RDW: 16.9 % — ABNORMAL HIGH (ref 11.5–15.5)
WBC: 1.8 10*3/uL — ABNORMAL LOW (ref 4.0–10.5)
nRBC: 0 % (ref 0.0–0.2)

## 2021-08-30 LAB — BASIC METABOLIC PANEL
Anion gap: 5 (ref 5–15)
BUN: 21 mg/dL — ABNORMAL HIGH (ref 6–20)
CO2: 22 mmol/L (ref 22–32)
Calcium: 8.2 mg/dL — ABNORMAL LOW (ref 8.9–10.3)
Chloride: 106 mmol/L (ref 98–111)
Creatinine, Ser: 2.11 mg/dL — ABNORMAL HIGH (ref 0.44–1.00)
GFR, Estimated: 26 mL/min — ABNORMAL LOW (ref >60)
Glucose, Bld: 77 mg/dL (ref 70–99)
Potassium: 4 mmol/L (ref 3.5–5.1)
Sodium: 133 mmol/L — ABNORMAL LOW (ref 135–145)

## 2021-08-30 LAB — FERRITIN: Ferritin: 501 ng/mL — ABNORMAL HIGH (ref 11–307)

## 2021-08-30 LAB — VITAMIN B12: Vitamin B-12: 1052 pg/mL — ABNORMAL HIGH (ref 180–914)

## 2021-08-30 LAB — IRON AND TIBC
Iron: 40 ug/dL (ref 28–170)
Saturation Ratios: 28 % (ref 10.4–31.8)
TIBC: 144 ug/dL — ABNORMAL LOW (ref 250–450)
UIBC: 104 ug/dL

## 2021-08-30 LAB — CORTISOL-AM, BLOOD: Cortisol - AM: 9.2 ug/dL (ref 6.7–22.6)

## 2021-08-30 MED ORDER — MAGNESIUM HYDROXIDE 400 MG/5ML PO SUSP: 30.0000 mL | Freq: Every day | ORAL | Status: DC

## 2021-08-30 MED ORDER — MIDODRINE HCL 5 MG PO TABS
10.0000 mg | ORAL_TABLET | Freq: Three times a day (TID) | ORAL | Status: DC
  Administered 2021-08-30 – 2021-08-31 (×5): 10 mg via ORAL
  Filled 2021-08-30 (×5): qty 2

## 2021-08-30 MED ORDER — MAGNESIUM HYDROXIDE 400 MG/5ML PO SUSP
30.0000 mL | Freq: Every day | ORAL | Status: DC | PRN
  Administered 2021-08-30: 30 mL via ORAL
  Filled 2021-08-30: qty 30

## 2021-08-30 NOTE — Progress Notes (Signed)
Progress Note    Tammy Hardin  ZOX:[id number] DOB: 1961-02-09  DOA: 08/26/2021 PCP: Reather Converse, MD      Brief Narrative:    Medical records reviewed and are as summarized below:  Tammy Hardin is a 60 y.o. female with a known history of disseminated amyloidosis including cardiac, kidney, history of DVT on Eliquis, rheumatoid arthritis, chronically elevated troponins, anemia of chronic disease, CKD stage IIIa, history of orthostasis with recurrent presyncope/syncope followed by [facility] cardiology, Bell's palsy.  She presented to the hospital because of chest pain and a syncopal episode.  She was found to have orthostatic hypotension and syncope was attributed to orthostatic hypotension.  Elevated troponin was likely from demand ischemia and no additional work-up was recommended.  She was treated with IV fluids and she was started on midodrine.    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  (Obesity)   Presyncope/syncope likely from orthostatic hypotension: She still has orthostatic hypotension: Increase midodrine from 5 mg to 10 mg 3 times daily.  Repeat orthostatic vitals tomorrow to assess response to treatment.  Check cortisol level tomorrow morning.  Elevated troponin: Likely from demand ischemia.  2D echo showed LVH and normal LV  function.  Disseminated amyloidosis (heart, stomach, kidneys): She gets infusion every [facility]  History of DVT: On Eliquis.  Pancytopenia: Hemoglobin has dropped from 9.2 on admission to 7.1.  No evidence of bleeding thus far.  Check iron studies and vitamin B12 level.  Give IV iron infusion if iron levels are low.  Continue vitamin B12 supplement for history of vitamin B12 deficiency.  Hypokalemia and hypomagnesemia: Improved  Other comorbidities include CKD stage IIIb, anemia of chronic disease,     Diet Order             Diet regular Room service appropriate? Yes; Fluid consistency: Thin  Diet effective now                      Consultants: Neurologist Cardiologist  Procedures: None    Medications:    apixaban  2.5 mg Oral BID   cholecalciferol  5,000 Units Oral Daily   feeding supplement  237 mL Oral TID BM   loratadine  10 mg Oral Daily   midodrine  10 mg Oral TID WC   multivitamin with minerals  1 tablet Oral Daily   pyridOXINE  25 mg Oral Daily   valACYclovir  500 mg Oral Daily   vitamin B-12  1,000 mcg Oral Daily   Continuous Infusions:     Anti-infectives (From admission, onward)    Start     Dose/Rate Route Frequency Ordered Stop   08/27/21 1000  valACYclovir (VALTREX) tablet 500 mg        500 mg Oral Daily 08/26/21 2351  Family Communication/Anticipated D/C date and plan/Code Status   DVT prophylaxis: Place TED hose Start: 08/29/21 1848 apixaban (ELIQUIS) tablet 2.5 mg Start: 08/27/21 1500 apixaban (ELIQUIS) tablet 2.5 mg     Code Status: Full Code  Family Communication: None Disposition Plan:    Status is: Inpatient  Remains inpatient appropriate because:Unsafe d/c plan and Inpatient level of care appropriate due to severity of illness  Dispo: The patient is from: Home              Anticipated d/c is to: SNF              Patient currently is not medically stable to d/c.   Difficult to  place patient No           Subjective:   Interval events noted.  She complained of dizziness when she got up to help with mobility specialist today.  Her blood pressure dropped when she stood up (from lying 103/65 to standing at 0 minutes to 75/56 and standing at 3 minutes to systolic BP of 63)   Objective:    Vitals:   08/29/21 2352 08/30/21 0442 08/30/21 0826 08/30/21 1233  BP: 104/61 (!) 99/53 101/68 106/63  Pulse: 75 77 78 88  Resp: 18 16 18 18   Temp: 98.4 F (36.9 C) 98.5 F (36.9 C) 98.3 F (36.8 C) 98.1 F (36.7 C)  TempSrc:    Oral  SpO2: 99% 100% 100% 100%  Weight:      Height:       Orthostatic VS for the past 24 hrs:  BP- Lying Pulse- Lying BP- Sitting BP- Standing at 0 minutes Pulse- Standing at 0 minutes  08/30/21 1053 103/65 76 91/66 (!) 75/56 --  08/30/21 1046 105/63 74 91/60 (!) 75/56 92    08/29/2021: Sitting BP 92/69, standing BP 72/50   No intake or output data in the 24 hours ending 08/30/21 1410  Filed Weights   08/26/21 1930 08/27/21 1720 08/29/21 0324  Weight: 80.7 kg 79 kg 80.8 kg    Exam:  GEN: NAD SKIN: No rash EYES: EOMI ENT: MMM CV: RRR PULM: CTA B ABD: soft, ND, NT, +BS CNS: AAO x 3, non focal EXT: No edema or tenderness        Data Reviewed:   I have personally reviewed following labs and imaging studies:  Labs: Labs show the following:   Basic Metabolic Panel: Recent Labs  Lab 08/26/21 1936 08/26/21 2130 08/27/21 1257 08/28/21 0532 08/29/21 0518 08/30/21 0409  NA 138  --   --  136 135 133*  K 3.3*  --   --  3.1* 3.5 4.0  CL 107  --   --  103 106 106  CO2 19*  --   --  22 22 22   GLUCOSE 100*  --   --  74 79 77  BUN 22*  --   --  22* 21* 21*  CREATININE 1.87*  --  1.35* 1.76* 1.92* 2.11*  CALCIUM 8.1*  --   --  8.2* 8.1* 8.2*  MG  --  1.6* 1.3* 2.1  --   --    GFR Estimated Creatinine Clearance: 27 mL/min (A) (by C-G formula based on SCr of 2.11 mg/dL (H)). Liver Function Tests: Recent Labs   Lab 08/26/21 1936  AST 15  ALT 10  ALKPHOS 49  BILITOT 1.1  PROT 4.3*  ALBUMIN 2.5*   No results for input(s): LIPASE, AMYLASE in the last 168 hours. No results for  input(s): AMMONIA in the last 168 hours. Coagulation profile Recent Labs  Lab 08/26/21 2130  INR 1.0    CBC: Recent Labs  Lab 08/26/21 1936 08/27/21 1257 08/28/21 0532 08/29/21 0518 08/30/21 0409  WBC 3.0* 2.3* 2.0* 1.9* 1.8*  NEUTROABS  --   --   --   --  1.3*  HGB 9.2* 7.4* 8.4* 7.7* 7.1*  HCT 27.9* 21.8* 24.0* 22.4* 21.4*  MCV 97.2 98.2 96.4 97.4 99.5  PLT 67* 55* 65* 71* 83*   Cardiac Enzymes: No results for input(s): CKTOTAL, CKMB, CKMBINDEX, TROPONINI in the last 168 hours. BNP (last 3 results) No results for input(s): PROBNP in the last 8760 hours. CBG: Recent Labs  Lab 08/28/21 1646  GLUCAP 81   D-Dimer: No results for input(s): DDIMER in the last 72 hours. Hgb A1c: No results for input(s): HGBA1C in the last 72 hours. Lipid Profile: No results for input(s): CHOL, HDL, LDLCALC, TRIG, CHOLHDL, LDLDIRECT in the last 72 hours.  Thyroid function studies: No results for input(s): TSH, T4TOTAL, T3FREE, THYROIDAB in the last 72 hours.  Invalid input(s): FREET3 Anemia work up: No results for input(s): VITAMINB12, FOLATE, FERRITIN, TIBC, IRON, RETICCTPCT in the last 72 hours. Sepsis Labs: Recent Labs  Lab 08/27/21 1257 08/28/21 0532 08/29/21 0518 08/30/21 0409  WBC 2.3* 2.0* 1.9* 1.8*    Microbiology Recent Results (from the past 240 hour(s))  Resp Panel by RT-PCR (Flu A&B, Covid) Nasopharyngeal Swab     Status: None   Collection Time: 08/26/21  9:30 PM   Specimen: Nasopharyngeal Swab; Nasopharyngeal(NP) swabs in vial transport medium  Result Value Ref Range Status   SARS Coronavirus 2 by RT PCR NEGATIVE NEGATIVE Final    Comment: (NOTE) SARS-CoV-2 target nucleic acids are NOT DETECTED.  The SARS-CoV-2 RNA is generally detectable in upper respiratory specimens during the acute  phase of infection. The lowest concentration of SARS-CoV-2 viral copies this assay can detect is 138 copies/mL. A negative result does not preclude SARS-Cov-2 infection and should not be used as the sole basis for treatment or other patient management decisions. A negative result may occur with  improper specimen collection/handling, submission of specimen other than nasopharyngeal swab, presence of viral mutation(s) within the areas targeted by this assay, and inadequate number of viral copies(<138 copies/mL). A negative result must be combined with clinical observations, patient history, and epidemiological information. The expected result is Negative.  Fact Sheet for Patients:  BloggerCourse.com  Fact Sheet for Healthcare Providers:  SeriousBroker.it  This test is no t yet approved or cleared by the [place] FDA and  has been authorized for detection and/or diagnosis of SARS-CoV-2 by FDA under an Emergency Use Authorization (EUA). This EUA will remain  in effect (meaning this test can be used) for the duration of the COVID-19 declaration under Section 564(b)(1) of the Act, 21 U.S.C.section 360bbb-3(b)(1), unless the authorization is terminated  or revoked sooner.       Influenza A by PCR NEGATIVE NEGATIVE Final   Influenza B by PCR NEGATIVE NEGATIVE Final    Comment: (NOTE) The Xpert Xpress SARS-CoV-2/FLU/RSV plus assay is intended as an aid in the diagnosis of influenza from Nasopharyngeal swab specimens and should not be used as a sole basis for treatment. Nasal washings and aspirates are unacceptable for Xpert Xpress SARS-CoV-2/FLU/RSV testing.  Fact Sheet for Patients: BloggerCourse.com  Fact Sheet for Healthcare Providers: SeriousBroker.it  This test is not yet approved or cleared by the [place] and has been authorized  for detection and/or diagnosis of  SARS-CoV-2 by FDA under an Emergency Use Authorization (EUA). This EUA will remain in effect (meaning this test can be used) for the duration of the COVID-19 declaration under Section 564(b)(1) of the Act, 21 U.S.C. section 360bbb-3(b)(1), unless the authorization is terminated or revoked.  Performed at [facility], [address] [place], [place] [address]     Procedures and diagnostic studies:  No results found.             LOS: 4 days   Gilverto Dileonardo  [facility]   Pager on [email or web address]. If 7PM-7AM, please contact night-coverage at [email or web address]     08/30/2021, 2:10 PM

## 2021-08-30 NOTE — Progress Notes (Signed)
Mobility Specialist - Progress Note   08/30/21 1600  Mobility  Activity Ambulated in hall  Level of Assistance Standby assist, set-up cues, supervision of patient - no hands on  Assistive Device Front wheel walker  Distance Ambulated (ft) 250 ft  Mobility Ambulated with assistance in hallway  Mobility Response Tolerated well  Mobility performed by Mobility specialist  $Mobility charge 1 Mobility    Supine BP prior to activity 93/59. Pt ambulated in hallway, no reports of dizziness throughout session. Returned EOB, BP 115/65. HR 82 bpm, O2 100% on RA.    Filiberto Pinks Mobility Specialist 08/30/21, 4:30 PM

## 2021-08-30 NOTE — Progress Notes (Signed)
Mobility Specialist - Progress Note   08/30/21 1053  Orthostatic Lying   BP- Lying 103/65  Pulse- Lying 76  Orthostatic Sitting  BP- Sitting 91/66  Orthostatic Standing at 0 minutes  BP- Standing at 0 minutes (!) 75/56  Orthostatic Standing at 3 minutes  BP- Standing at 3 minutes (!) 63/0  Mobility  Activity Stood at bedside  Range of Motion/Exercises Left leg;Right leg  Level of Assistance Standby assist, set-up cues, supervision of patient - no hands on  Assistive Device Front wheel walker  Distance Ambulated (ft) 0 ft  Mobility Response Tolerated well  Mobility performed by Mobility specialist  $Mobility charge 1 Mobility    Orthostatics recorded above. Pt symptomatic with dizziness and blurred vision, does resolve once returned supine with LE elevated---BP 130/68 prior to exit. Pt motivated to attempt ambulation later this date. Will attempt as time permits.    Filiberto Pinks Mobility Specialist 08/30/21, 1:43 PM

## 2021-08-30 NOTE — TOC Initial Note (Signed)
Transition of Care [facility]) - Initial/Assessment Note    Patient Details  Name: Tammy Hardin MRN: [id number] Date of Birth: [birth year]  Transition of Care [facility]) CM/SW Contact:    Gildardo Griffes, LCSW Phone Number: 08/30/2021, 2:36 PM  Clinical Narrative:                  CSW spoke with patient regarding home health recommendations, patient is in agreement with having HH PT, OT and RN at discharge with no preference of agency.   She does report having a walker at home, reports being pretty independent and not using it much.   Patient reports when she leaves the hospital she will be staying at her son Tammy Hardin' house and gave CSW permission to call him as she cannot remember the address.   CSW has called Tammy Hardin and lvm.   CS[facility], pending acceptance.   Expected Discharge Plan: Home w Home Health Services Barriers to Discharge: Continued Medical Work up   Patient Goals and CMS Choice Patient states their goals for this hospitalization and ongoing recovery are:: to go home CMS Medicare.gov Compare Post Acute Care list provided to:: Patient Choice offered to / list presented to : Patient  Expected Discharge Plan and Services Expected Discharge Plan: Home w Home Health Services     Post Acute Care Choice: Home Health Living arrangements for the past 2 months: Single Family Home                           HH Arranged: PT, OT, RN [facility] Agency: [facility] Date HH Agency Contacted: 08/30/21 Time HH Agency Contacted: 1434 Representative spoke with at [facility] Agency: [facility]  Prior Living Arrangements/Services Living arrangements for the past 2 months: Single Family Home Lives with:: Self Patient language and need for interpreter reviewed:: Yes Do you feel safe going back to the place where you live?: Yes      Need for Family Participation in Patient Care: Yes (Comment) Care giver support system in place?: Yes (comment)   Criminal  Activity/Legal Involvement Pertinent to Current Situation/Hospitalization: No - Comment as needed  Activities of Daily Living Home Assistive Devices/Equipment: Cane (specify quad or straight), Walker (specify type) ADL Screening (condition at time of admission) Patient's cognitive ability adequate to safely complete daily activities?: Yes Is the patient deaf or have difficulty hearing?: No Does the patient have difficulty seeing, even when wearing glasses/contacts?: No Does the patient have difficulty concentrating, remembering, or making decisions?: No Patient able to express need for assistance with ADLs?: Yes Does the patient have difficulty dressing or bathing?: Yes Independently performs ADLs?: No Does the patient have difficulty walking or climbing stairs?: Yes Weakness of Legs: Both Weakness of Arms/Hands: None  Permission Sought/Granted Permission sought to share information with : Case Manager, Magazine features editor, Family Supports Permission granted to share information with : Yes, Verbal Permission Granted  Share Information with NAME: Tammy Hardin  Permission granted to share info w AGENCY: HH  Permission granted to share info w Relationship: son  Permission granted to share info w Contact Information: [phone]  Emotional Assessment Appearance:: Appears stated age Attitude/Demeanor/Rapport: Gracious Affect (typically observed): Calm Orientation: : Oriented to Self, Oriented to Place, Oriented to  Time, Oriented to Situation Alcohol / Substance Use: Not Applicable Psych Involvement: No (comment)  Admission diagnosis:  NSTEMI (non-ST elevated myocardial infarction) (HCC) [I21.4] Syncope, unspecified syncope type [R55] Patient Active Problem  List   Diagnosis Date Noted   Malnutrition of moderate degree 08/28/2021   Demand ischemia (HCC) 08/27/2021   NSTEMI (non-ST elevated myocardial infarction) (HCC) 08/26/2021   Pancytopenia (HCC) 08/26/2021    Hypomagnesemia 08/26/2021   Cardiac amyloidosis (HCC) 05/06/2021   AL amyloidosis (HCC) 04/20/2021   Postural dizziness with presyncope 03/22/2021   Chronic anticoagulation 03/20/2021   Chronic deep vein thrombosis (DVT) of proximal vein of left lower extremity (HCC) 03/20/2021   Patient is Jehovah's Witness 03/20/2021   Anemia in chronic kidney disease 02/24/2021   Hypertension 02/13/2021   Syncope and collapse 01/28/2021   Troponin level elevated 01/28/2021   Rheumatoid arthritis (HCC) 07/13/2010   PCP:  Reather Converse, MD Pharmacy:   [id number] [place], [place] - [address] AT [facility] [address] [place] [address] Phone: [phone] Fax: [phone]     Social Determinants of Health (SDOH) Interventions    Readmission Risk Interventions No flowsheet data found.

## 2021-08-31 DIAGNOSIS — R42 Dizziness and giddiness: Secondary | ICD-10-CM | POA: Diagnosis not present

## 2021-08-31 DIAGNOSIS — R55 Syncope and collapse: Secondary | ICD-10-CM | POA: Diagnosis not present

## 2021-08-31 LAB — CBC WITH DIFFERENTIAL/PLATELET
Abs Immature Granulocytes: 0.03 10*3/uL (ref 0.00–0.07)
Basophils Absolute: 0 10*3/uL (ref 0.0–0.1)
Basophils Relative: 2 %
Eosinophils Absolute: 0 10*3/uL (ref 0.0–0.5)
Eosinophils Relative: 1 %
HCT: 21.3 % — ABNORMAL LOW (ref 36.0–46.0)
Hemoglobin: 7.4 g/dL — ABNORMAL LOW (ref 12.0–15.0)
Immature Granulocytes: 1 %
Lymphocytes Relative: 8 %
Lymphs Abs: 0.2 10*3/uL — ABNORMAL LOW (ref 0.7–4.0)
MCH: 34.6 pg — ABNORMAL HIGH (ref 26.0–34.0)
MCHC: 34.7 g/dL (ref 30.0–36.0)
MCV: 99.5 fL (ref 80.0–100.0)
Monocytes Absolute: 0.2 10*3/uL (ref 0.1–1.0)
Monocytes Relative: 11 %
Neutro Abs: 1.7 10*3/uL (ref 1.7–7.7)
Neutrophils Relative %: 77 %
Platelets: 103 10*3/uL — ABNORMAL LOW (ref 150–400)
RBC: 2.14 MIL/uL — ABNORMAL LOW (ref 3.87–5.11)
RDW: 16.9 % — ABNORMAL HIGH (ref 11.5–15.5)
WBC: 2.2 10*3/uL — ABNORMAL LOW (ref 4.0–10.5)
nRBC: 0 % (ref 0.0–0.2)

## 2021-08-31 MED ORDER — MIDODRINE HCL 10 MG PO TABS: 10.0000 mg | ORAL_TABLET | Freq: Three times a day (TID) | ORAL | 1 refills | Status: AC

## 2021-08-31 NOTE — TOC Transition Note (Addendum)
Transition of Care [facility]) - CM/SW Discharge Note   Patient Details  Name: Tammy Hardin MRN: [id number] Date of Birth: 07/15/1961  [name] CM/SW Contact:  Gildardo Griffes, LCSW Phone Number: 08/31/2021, 3:09 PM   Clinical Narrative:     Patient to discharge home with home health services of PT, OT and RN. CSW had reached out to [name] with [name] who reports they are able to accept patient.   Patient will discharge to her son [name]' home at  [address] in [place], [place].   CSW spoke with [name] regarding discharge today, he reports patient will call him when she's ready to be picked up.   Patient has identified no DME needs.    Final next level of care: Home w Home Health Services Barriers to Discharge: No Barriers Identified   Patient Goals and CMS Choice Patient states their goals for this hospitalization and ongoing recovery are:: to go home CMS Medicare.gov Compare Post Acute Care list provided to:: Patient Choice offered to / list presented to : Patient  Discharge Placement                  Name of family member notified: son [name] Patient and family notified of of transfer: 08/31/21  Discharge Plan and Services     Post Acute Care Choice: Home Health                    HH Arranged: PT, OT, RN [name] Agency: [name] Date [facility] Agency Contacted: 08/31/21 Time HH Agency Contacted: 1500 Representative spoke with at [facility] Agency: [name]  Social Determinants of Health (SDOH) Interventions     Readmission Risk Interventions No flowsheet data found.

## 2021-08-31 NOTE — Discharge Summary (Signed)
DISCHARGE SUMMARY  Tammy Hardin  MR#: [id number]  DOB:27-Nov-1961  Date of Admission: 08/26/2021 Date of Discharge: 08/31/2021  Attending Physician:Oshua Mcconaha Silvestre Gunner, MD  Patient's OXB:[id number], Nicholos Johns, MD  Consults: none   Disposition: D/C home    Follow-up Appts:  Follow-up Information     Reather Converse, MD Follow up in 1 week(s).   Specialty: Internal Medicine Contact information: [address] [place] [address] [phone]                 Discharge Diagnoses: Recurrent syncope Orthostatic hypotension Mildly elevated troponin - demand ischemia Disseminated amyloidosis History of DVT Pancytopenia B12 deficiency Hypokalemia Hypomagnesemia CKD stage IIIb  Initial presentation: 60 year old with a history of disseminated amyloidosis (cardiac and kidney), DVT on chronic Eliquis, RA, ACD, CKD stage IIIa, recurrent orthostasis with frequent syncopal spells, and chronically elevated troponins who presented to the hospital with chest pain and a syncopal episode.  The patient was found to be orthostatic at the time of evaluation.  Hospital Course:   Recurrent syncope -orthostatic hypotension Midodrine increased from 5 to 10 mg 3 times daily - sx resolved -able to ambulate in hall without difficulty   Mildly elevated troponin Felt to be due to demand ischemia -TTE noted LVH with normal LV function -no chest pain   Disseminated amyloidosis Followed at [facility] with infusions every Monday -no evidence of acute exacerbation   History of DVT Continue chronic Eliquis   Pancytopenia Platelet count trending up since admission   B12 deficiency B12 1000 this admission   Hypokalemia Corrected with supplementation   Hypomagnesemia Corrected with supplementation   CKD stage IIIb Creatinine has waxed and waned since admission - ongoing outpatient w/ indicated   Allergies as of 08/31/2021       Reactions   Humira [adalimumab]    Iodine    Latex          Medication List     STOP taking these medications    clotrimazole 10 MG troche Commonly known as: MYCELEX       TAKE these medications    Cholecalciferol 125 MCG (5000 UT) Tabs Take 1 tablet by mouth daily.   Cyanocobalamin 1000 MCG Subl Place 1 tablet under the tongue daily.   Eliquis 2.5 MG Tabs tablet Generic drug: apixaban Take 2.5 mg by mouth 2 (two) times daily.   loratadine 10 MG tablet Commonly known as: CLARITIN Take 10 mg by mouth daily.   midodrine 10 MG tablet Commonly known as: PROAMATINE Take 1 tablet (10 mg total) by mouth 3 (three) times daily with meals.   prochlorperazine 10 MG tablet Commonly known as: COMPAZINE Take 10 mg by mouth every 6 (six) hours as needed.   pyridOXINE 25 MG tablet Commonly known as: VITAMIN B-6 Take 25 mg by mouth daily.   valACYclovir 500 MG tablet Commonly known as: VALTREX Take 500 mg by mouth daily.        Day of Discharge BP 110/72 (BP Location: Right Arm)   Pulse 67   Temp 97.8 F (36.6 C)   Resp 17   Ht 5' (1.524 m)   Wt 80.8 kg   SpO2 100%   BMI 34.78 kg/m   Physical Exam: General: No acute respiratory distress Lungs: Clear to auscultation bilaterally without wheezes or crackles Cardiovascular: Regular rate and rhythm without murmur gallop or rub normal S1 and S2 Abdomen: Nontender, nondistended, soft, bowel sounds positive, no rebound, no ascites, no appreciable mass Extremities: No significant cyanosis, clubbing, or edema  bilateral lower extremities  Basic Metabolic Panel: Recent Labs  Lab 08/26/21 1936 08/26/21 2130 08/27/21 1257 08/28/21 0532 08/29/21 0518 08/30/21 0409  NA 138  --   --  136 135 133*  K 3.3*  --   --  3.1* 3.5 4.0  CL 107  --   --  103 106 106  CO2 19*  --   --  22 22 22   GLUCOSE 100*  --   --  74 79 77  BUN 22*  --   --  22* 21* 21*  CREATININE 1.87*  --  1.35* 1.76* 1.92* 2.11*  CALCIUM 8.1*  --   --  8.2* 8.1* 8.2*  MG  --  1.6* 1.3* 2.1  --   --      Liver Function Tests: Recent Labs  Lab 08/26/21 1936  AST 15  ALT 10  ALKPHOS 49  BILITOT 1.1  PROT 4.3*  ALBUMIN 2.5*    Coags: Recent Labs  Lab 08/26/21 2130  INR 1.0    CBC: Recent Labs  Lab 08/27/21 1257 08/28/21 0532 08/29/21 0518 08/30/21 0409 08/31/21 0749  WBC 2.3* 2.0* 1.9* 1.8* 2.2*  NEUTROABS  --   --   --  1.3* 1.7  HGB 7.4* 8.4* 7.7* 7.1* 7.4*  HCT 21.8* 24.0* 22.4* 21.4* 21.3*  MCV 98.2 96.4 97.4 99.5 99.5  PLT 55* 65* 71* 83* 103*     CBG: Recent Labs  Lab 08/28/21 1646  GLUCAP 81    Recent Results (from the past 240 hour(s))  Resp Panel by RT-PCR (Flu A&B, Covid) Nasopharyngeal Swab     Status: None   Collection Time: 08/26/21  9:30 PM   Specimen: Nasopharyngeal Swab; Nasopharyngeal(NP) swabs in vial transport medium  Result Value Ref Range Status   SARS Coronavirus 2 by RT PCR NEGATIVE NEGATIVE Final    Comment: (NOTE) SARS-CoV-2 target nucleic acids are NOT DETECTED.  The SARS-CoV-2 RNA is generally detectable in upper respiratory specimens during the acute phase of infection. The lowest concentration of SARS-CoV-2 viral copies this assay can detect is 138 copies/mL. A negative result does not preclude SARS-Cov-2 infection and should not be used as the sole basis for treatment or other patient management decisions. A negative result may occur with  improper specimen collection/handling, submission of specimen other than nasopharyngeal swab, presence of viral mutation(s) within the areas targeted by this assay, and inadequate number of viral copies(<138 copies/mL). A negative result must be combined with clinical observations, patient history, and epidemiological information. The expected result is Negative.  Fact Sheet for Patients:  08/28/21  Fact Sheet for Healthcare Providers:  BloggerCourse.com  This test is no t yet approved or cleared by the [place] FDA  and  has been authorized for detection and/or diagnosis of SARS-CoV-2 by FDA under an Emergency Use Authorization (EUA). This EUA will remain  in effect (meaning this test can be used) for the duration of the COVID-19 declaration under Section 564(b)(1) of the Act, 21 U.S.C.section 360bbb-3(b)(1), unless the authorization is terminated  or revoked sooner.       Influenza A by PCR NEGATIVE NEGATIVE Final   Influenza B by PCR NEGATIVE NEGATIVE Final    Comment: (NOTE) The Xpert Xpress SARS-CoV-2/FLU/RSV plus assay is intended as an aid in the diagnosis of influenza from Nasopharyngeal swab specimens and should not be used as a sole basis for treatment. Nasal washings and aspirates are unacceptable for Xpert Xpress SARS-CoV-2/FLU/RSV testing.  Fact Sheet for Patients:  BloggerCourse.com  Fact Sheet for Healthcare Providers: [employer]ious[employer]ker.it  This test is not yet approved or cleared by the [place] FDA and has been authorized for detection and/or diagnosis of SARS-CoV-2 by FDA under an Emergency Use Authorization (EUA). This EUA will remain in effect (meaning this test can be used) for the duration of the COVID-19 declaration under Section 564(b)(1) of the Act, 21 U.S.C. section 360bbb-3(b)(1), unless the authorization is terminated or revoked.  Performed at [facility], [address] [place], [place] [address]       Time spent in discharge (includes decision making & examination of pt): 30 minutes  08/31/2021, 2:21 PM   Lonia Blood, MD [facility] Office  [phone]

## 2021-08-31 NOTE — Progress Notes (Signed)
Physical Therapy Treatment Patient Details Name: Tammy Hardin MRN: [id number] DOB: 1961/03/17 Today's Date: 08/31/2021    History of Present Illness Tammy Hardin is 60 year old female with a known history of disseminated amyloidosis including cardiac and kidney; history of DVT on Eliquis; rheumatoid arthritis; chronically elevated troponins; anemia of chronic disease; CKD stage IIIa, Bell's palsy. Brought to ED for presyncope/syncope and chest pain.    PT Comments    Pt agreed to PT, states no dizziness in last 24 hours+. Pt able to complete bed mobility and transfers with ModI/Supervision.  Gait with RW 100+ feet with good pace and supervision.  (Pt has RW at home, and son has a rollator)  Pt negotiated 8 steps with bilateral rails, step to pattern with CGA and no c/o fatigue or dizziness.  Overall great tolerance for PT, appears functionally ready to d/c to son's home. Until then, continue PT per POC.    Follow Up Recommendations  Home health PT;Supervision/Assistance - 24 hour     Equipment Recommendations  None recommended by PT    Recommendations for Other Services       Precautions / Restrictions Precautions Precautions: Fall Restrictions Weight Bearing Restrictions: No    Mobility  Bed Mobility Overal bed mobility: Modified Independent Bed Mobility: Supine to Sit;Sit to Supine     Supine to sit: Modified independent (Device/Increase time) Sit to supine: Modified independent (Device/Increase time)        Transfers Overall transfer level: Needs assistance Equipment used: Rolling walker (2 wheeled) Transfers: Sit to/from Stand Sit to Stand: Supervision         General transfer comment:  (No c/o dizziness with positional changes)  Ambulation/Gait Ambulation/Gait assistance: Supervision;Min guard Gait Distance (Feet): 100 Feet Assistive device: Rolling walker (2 wheeled) Gait Pattern/deviations: Step-through pattern     General Gait Details:  (Good  continuous pace, no dizziness)   Stairs Stairs: Yes Stairs assistance: Min guard Stair Management: Two rails;Step to pattern Number of Stairs: 8 General stair comments: Good tolerance for stairs, no rest break needed.   Wheelchair Mobility    Modified Rankin (Stroke Patients Only)       Balance                                            Cognition Arousal/Alertness: Awake/alert Behavior During Therapy: WFL for tasks assessed/performed Overall Cognitive Status: Within Functional Limits for tasks assessed                                 General Comments: pleasant and agreeable to therapy      Exercises      General Comments        Pertinent Vitals/Pain Pain Assessment: No/denies pain    Home Living                      Prior Function            PT Goals (current goals can now be found in the care plan section) Acute Rehab PT Goals Patient Stated Goal:  (To go to my son's house)    Frequency    Min 2X/week      PT Plan      Co-evaluation              AM-PAC PT "6  Clicks" Mobility   Outcome Measure  Help needed turning from your back to your side while in a flat bed without using bedrails?: None Help needed moving from lying on your back to sitting on the side of a flat bed without using bedrails?: None Help needed moving to and from a bed to a chair (including a wheelchair)?: None Help needed standing up from a chair using your arms (e.g., wheelchair or bedside chair)?: None Help needed to walk in hospital room?: None Help needed climbing 3-5 steps with a railing? : A Little 6 Click Score: 23    End of Session Equipment Utilized During Treatment: Gait belt Activity Tolerance: Patient tolerated treatment well Patient left: in bed;with call bell/phone within reach Nurse Communication: Mobility status PT Visit Diagnosis: Unsteadiness on feet (R26.81);Muscle weakness (generalized) (M62.81);History of  falling (Z91.81);Difficulty in walking, not elsewhere classified (R26.2);Dizziness and giddiness (R42)     Time: 2409-7353 PT Time Calculation (min) (ACUTE ONLY): 24 min  Charges:  $Gait Training: 23-37 mins                    Zadie Cleverly, PTA    Jannet Askew 08/31/2021, 1:36 PM

## 2021-08-31 NOTE — Progress Notes (Signed)
Mobility Specialist - Progress Note   08/31/21 0900  Mobility  Activity Ambulated in hall  Level of Assistance Standby assist, set-up cues, supervision of patient - no hands on  Assistive Device Front wheel walker  Distance Ambulated (ft) 200 ft  Mobility Ambulated with assistance in hallway  Mobility Response Tolerated well  Mobility performed by Mobility specialist  $Mobility charge 1 Mobility    Pre-mobility: 71 HR, 107/70 BP, 100% SpO2 Post-mobility: 83 HR, 118/73 BP, 100% SpO2   Pt ambulated in hallway with RW and supervision. Mild lightheadedness once seated, but does not increase with mobility. Pt left EOB with alarm set, needs in reach.    Tammy Hardin Mobility Specialist 08/31/21, 9:29 AM

## 2021-08-31 NOTE — Care Management Important Message (Signed)
Important Message  Patient Details  Name: Tammy Hardin MRN: [id number] Date of Birth: Nov 18, 1961   Medicare Important Message Given:  Yes     Johnell Comings 08/31/2021, 5:16 PM

## 2021-08-31 NOTE — Progress Notes (Signed)
Occupational Therapy Treatment Patient Details Name: Tammy Hardin MRN: [id number] DOB: 09/19/1961 Today's Date: 08/31/2021    History of present illness Pt. is a 60 year old female with a known history of disseminated amyloidosis including cardiac and kidney; history of DVT on Eliquis; rheumatoid arthritis; chronically elevated troponins; anemia of chronic disease; CKD stage IIIa, Bell's palsy. Brought to ED for presyncope/syncope and chest pain.   OT comments  Pt. Is preparing to discharge this afternoon. Pt. education was provided about A/E use for LE ADLs/IADLs. for work simplification strategies. Pt. Reports having a reacher at home, however the string broke on it, and she is unable to use it. Pt. Continues to benefit from OT services for ADL training, A/E training, and pt. Education about home modification. Recommend follow-up OT services at the next venue of care.    Follow Up Recommendations  SNF    Equipment Recommendations       Recommendations for Other Services      Precautions / Restrictions Precautions Precautions: Fall Restrictions Weight Bearing Restrictions: No       Mobility Bed Mobility Overal bed mobility: Modified Independent Bed Mobility: Supine to Sit;Sit to Supine     Supine to sit: Modified independent (Device/Increase time) Sit to supine: Modified independent (Device/Increase time)        Transfers Overall transfer level: Needs assistance Equipment used: Rolling walker (2 wheeled) Transfers: Sit to/from Stand Sit to Stand: Supervision         General transfer comment:  (No c/o dizziness with positional changes)    Balance                                           ADL either performed or assessed with clinical judgement   ADL Overall ADL's : Needs assistance/impaired Eating/Feeding: Independent                   Lower Body Dressing: Supervision/safety Lower Body Dressing Details (indicate cue type and  reason): donning socks Toilet Transfer: BSC;Min guard;Stand-pivot   Toileting- Clothing Manipulation and Hygiene: Min guard               Vision Patient Visual Report: No change from baseline     Perception     Praxis      Cognition Arousal/Alertness: Awake/alert Behavior During Therapy: WFL for tasks assessed/performed Overall Cognitive Status: Within Functional Limits for tasks assessed                                 General Comments: pleasant and agreeable to therapy        Exercises     Shoulder Instructions       General Comments      Pertinent Vitals/ Pain       Pain Assessment: No/denies pain  Home Living                                          Prior Functioning/Environment              Frequency  Min 1X/week        Progress Toward Goals  OT Goals(current goals can now be found in the care plan section)  Progress towards OT goals: Progressing  toward goals  Acute Rehab OT Goals Patient Stated Goal:  (To go to my son's house) OT Goal Formulation: With patient Time For Goal Achievement: 09/10/21 Potential to Achieve Goals: Good  Plan Discharge plan needs to be updated    Co-evaluation                 AM-PAC OT "6 Clicks" Daily Activity     Outcome Measure   Help from another person eating meals?: None Help from another person taking care of personal grooming?: A Little Help from another person toileting, which includes using toliet, bedpan, or urinal?: A Little Help from another person bathing (including washing, rinsing, drying)?: A Little Help from another person to put on and taking off regular upper body clothing?: A Little Help from another person to put on and taking off regular lower body clothing?: A Little 6 Click Score: 19    End of Session Equipment Utilized During Treatment: Rolling walker  OT Visit Diagnosis: Unsteadiness on feet (R26.81);Muscle weakness (generalized)  (M62.81);Dizziness and giddiness (R42)   Activity Tolerance Patient tolerated treatment well   Patient Left in chair;with call bell/phone within reach;with chair alarm set   Nurse Communication          Time: 1425-1440 OT Time Calculation (min): 15 min  Charges: OT General Charges $OT Visit: 1 Visit OT Treatments $Self Care/Home Management : 8-22 mins Olegario Messier, MS, OTR/L    Olegario Messier 08/31/2021, 2:43 PM

## 2022-10-19 IMAGING — MR MR HEAD W/O CM
11 series · 48 of 48 positions shown · non-contrast
Comparison: CT head 08/26/2021.

CLINICAL DATA: Dizziness, non-specific

EXAM:
MRI HEAD WITHOUT CONTRAST
TECHNIQUE: Multiplanar, multiecho pulse sequences of the brain and surrounding
structures were obtained without intravenous contrast.

[Series 5: ax dwi_tracew · axial · 3.0mm · 0.65mm/px · z∈[-141,+10]mm · 4 of 48 slices shown]
[im 1/48]
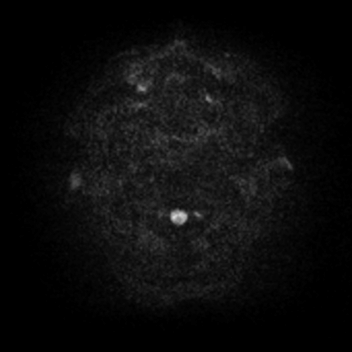
[im 16/48]
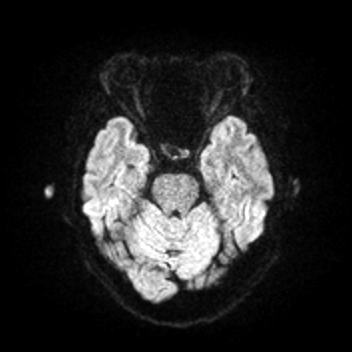
[im 32/48]
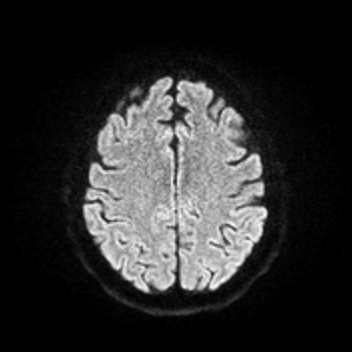
[im 48/48]
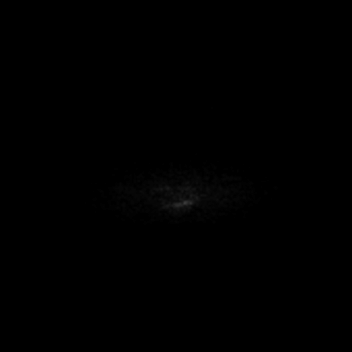

[Series 6: ax dwi_adc · axial · 3.0mm · 0.65mm/px · z∈[-141,+7]mm · 4 of 47 slices shown]
[im 1/47]
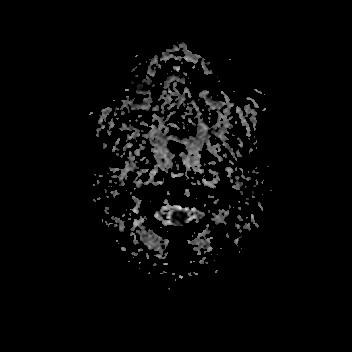
[im 16/47]
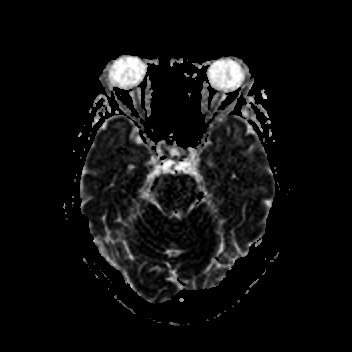
[im 31/47]
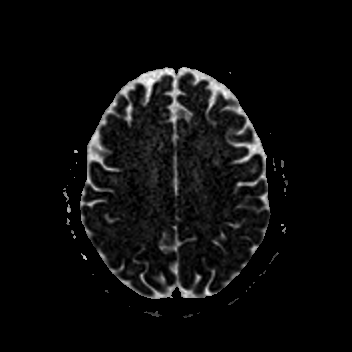
[im 47/47]
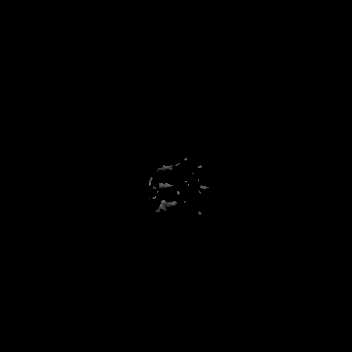

[Series 7: cor dwi_tracew · coronal · 5.0mm · 0.68mm/px · 3 of 40 slices shown]
[im 1/40]
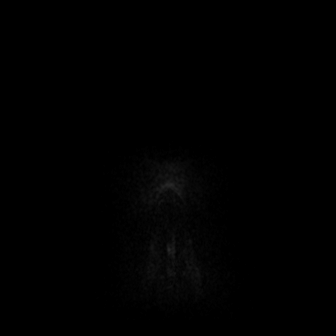
[im 20/40]
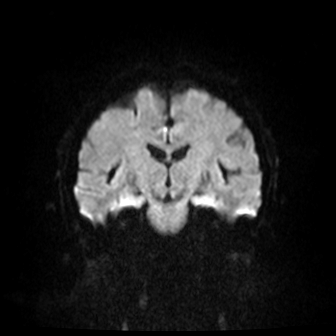
[im 40/40]
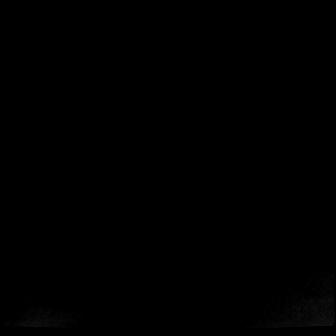

[Series 8: cor dwi_adc · coronal · 5.0mm · 0.68mm/px · 3 of 39 slices shown]
[im 1/39]
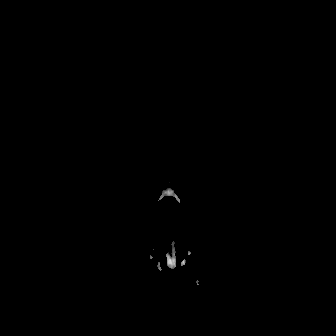
[im 20/39]
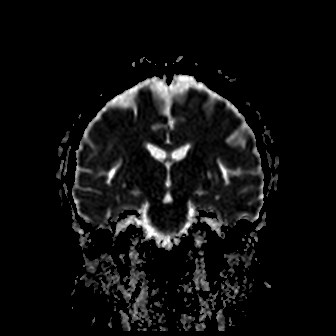
[im 39/39]
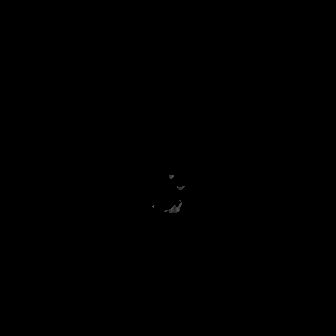

[Series 9: T1 · sagittal · 5.0mm · 0.62mm/px · 2 of 21 slices shown (1 of 2)]
[im 1/21]
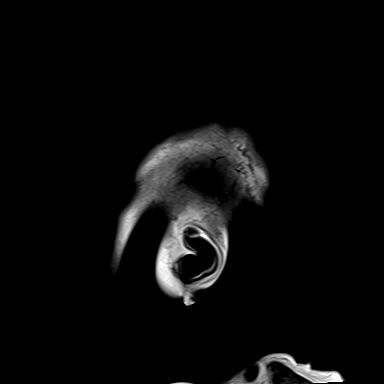
[im 21/21]
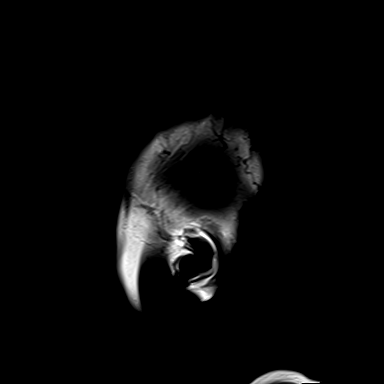

[Series 10: T2 · axial · 5.0mm · 0.53mm/px · z∈[-134,+6]mm · 2 of 25 slices shown (1 of 2)]
[im 1/25]
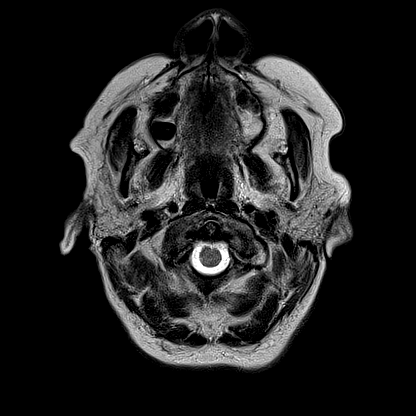
[im 25/25]
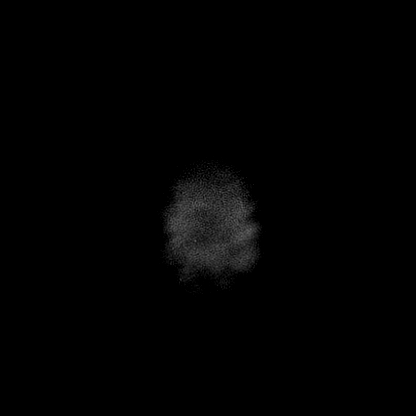

[Series 12: pha_images · axial · 3.0mm · 0.90mm/px · z∈[-140,+3]mm · 4 of 50 slices shown]
[im 1/50]
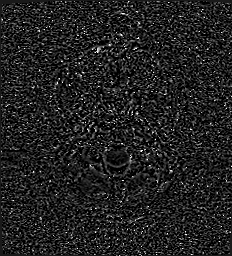
[im 17/50]
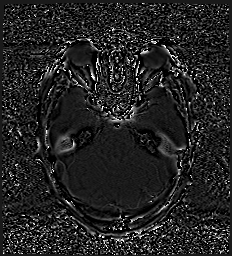
[im 33/50]
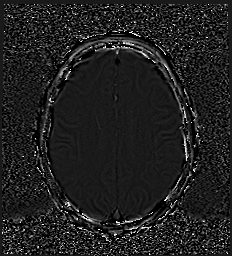
[im 50/50]
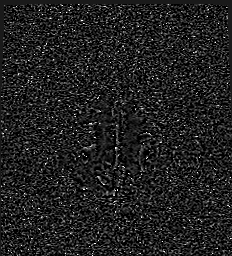

[Series 13: swi_images · axial · 3.0mm · 0.90mm/px · z∈[-140,+9]mm · 4 of 52 slices shown]
[im 1/52]
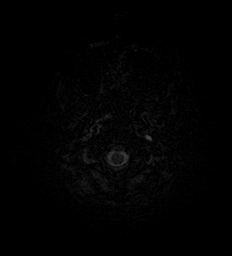
[im 18/52]
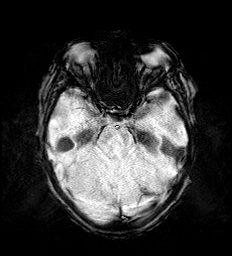
[im 35/52]
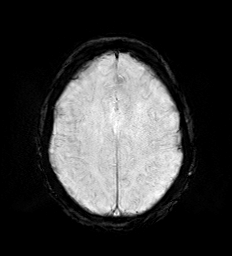
[im 52/52]
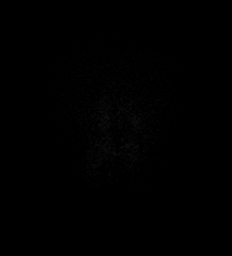

[Series 15: FLAIR · axial · 3.0mm · 0.69mm/px · z∈[-143,+15]mm · 5 of 55 slices shown]
[im 1/55]
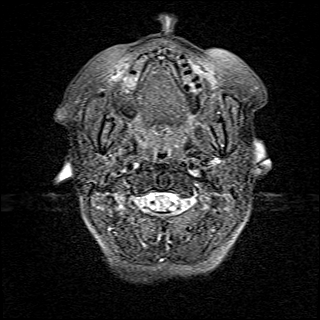
[im 14/55]
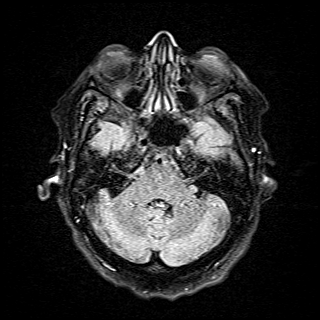
[im 28/55]
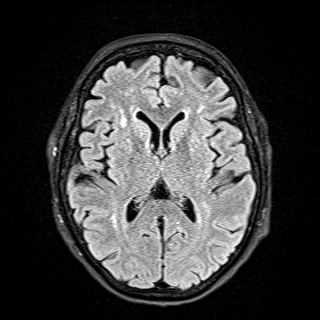
[im 41/55]
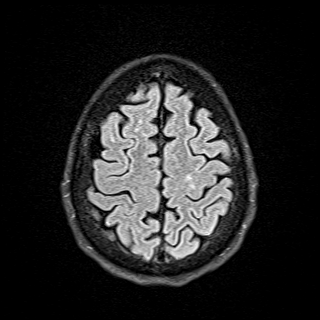
[im 55/55]
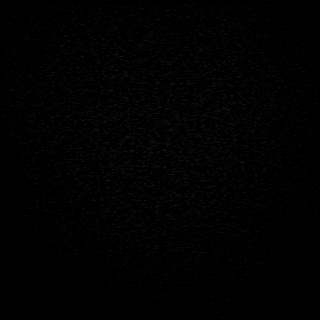

[Series 16: T1 · axial · 1.0mm · 0.98mm/px · z∈[-154,+16]mm · 15 of 176 slices shown (2 of 2)]
[im 1/176]
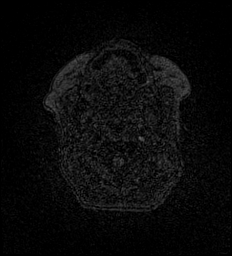
[im 13/176]
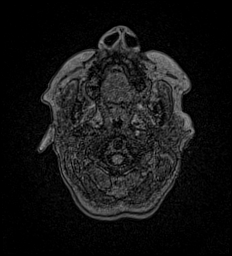
[im 26/176]
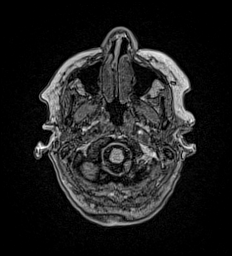
[im 38/176]
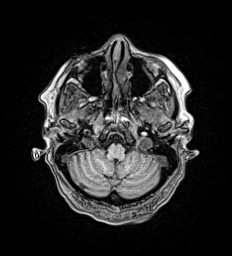
[im 51/176]
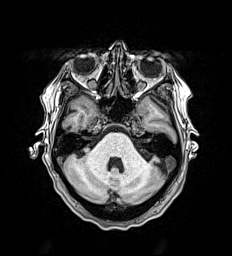
[im 63/176]
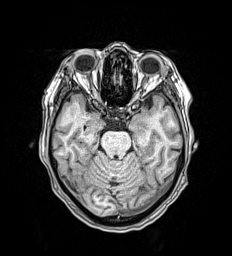
[im 76/176]
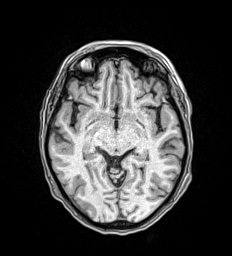
[im 88/176]
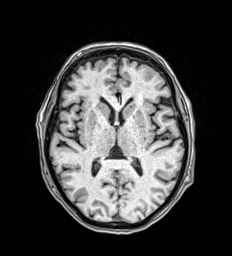
[im 101/176]
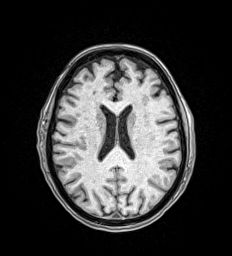
[im 113/176]
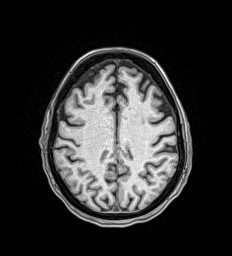
[im 126/176]
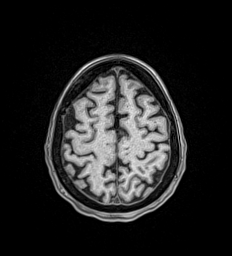
[im 138/176]
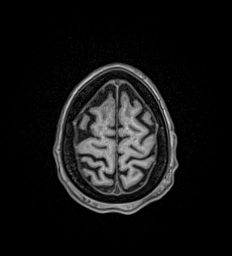
[im 151/176]
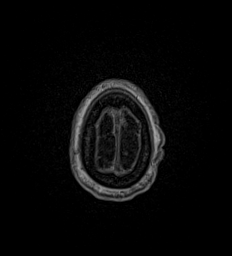
[im 163/176]
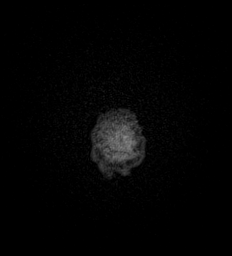
[im 176/176]
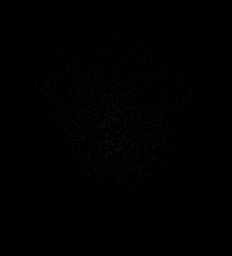

[Series 17: T2 · coronal · 5.0mm · 0.57mm/px · 2 of 29 slices shown (2 of 2)]
[im 1/29]
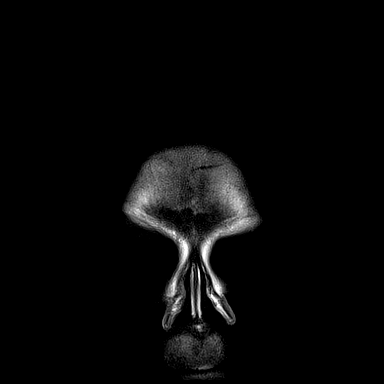
[im 29/29]
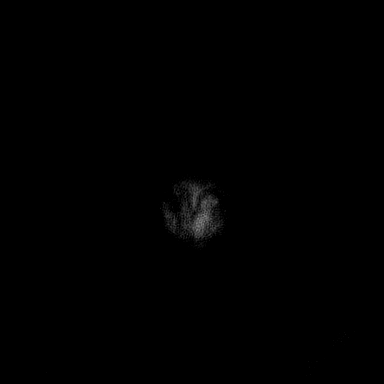

[48 of 48 positions shown; findings below may reference images not displayed]

FINDINGS: Brain: No acute infarction, hemorrhage, hydrocephalus, extra-axial
collection or mass lesion. Mild to moderate T2 hyperintensities
within the white matter, which are nonspecific but most likely
related to chronic microvascular ischemic disease given the
patient's risk factors. Ossification along the falx.

Vascular: Major arterial flow voids are maintained at the skull
base.

Skull and upper cervical spine: Normal marrow signal.

Sinuses/Orbits: Negative.

Other: No sizable mastoid effusions.
IMPRESSION: 1. No evidence of acute intracranial abnormality.
2. Mild to moderate chronic microvascular ischemic disease.
